# Patient Record
Sex: Male | Born: 1990 | Race: Black or African American | Hispanic: No | Marital: Single | Smoking: Light tobacco smoker
Health system: Southern US, Community
[De-identification: ages and names within clinical notes are randomized; demographics above are authoritative.]

## PROBLEM LIST (undated history)

## (undated) DIAGNOSIS — G40409 Other generalized epilepsy and epileptic syndromes, not intractable, without status epilepticus: Secondary | ICD-10-CM

## (undated) HISTORY — DX: Other generalized epilepsy and epileptic syndromes, not intractable, without status epilepticus: G40.409

---
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
## 2009-01-14 ENCOUNTER — Inpatient Hospital Stay (HOSPITAL_COMMUNITY): Admission: EM | Admit: 2009-01-14 | Discharge: 2009-01-16 | Payer: Self-pay | Admitting: Emergency Medicine

## 2009-01-29 ENCOUNTER — Ambulatory Visit: Payer: Self-pay | Admitting: Family Medicine

## 2009-01-29 DIAGNOSIS — G40909 Epilepsy, unspecified, not intractable, without status epilepticus: Secondary | ICD-10-CM

## 2009-01-29 DIAGNOSIS — G4700 Insomnia, unspecified: Secondary | ICD-10-CM

## 2009-01-29 LAB — CONVERTED CEMR LAB
ALT: 21 units/L (ref 0–53)
Alkaline Phosphatase: 71 units/L (ref 39–117)
Basophils Absolute: 0 10*3/uL (ref 0.0–0.1)
Basophils Relative: 1 % (ref 0–1)
Bilirubin Urine: NEGATIVE
Blood in Urine, dipstick: NEGATIVE
CO2: 22 meq/L (ref 19–32)
Calcium: 10 mg/dL (ref 8.4–10.5)
Chloride: 104 meq/L (ref 96–112)
Creatinine, Ser: 1 mg/dL (ref 0.40–1.50)
Eosinophils Absolute: 0.9 10*3/uL — ABNORMAL HIGH (ref 0.0–0.7)
Eosinophils Relative: 10 % — ABNORMAL HIGH (ref 0–5)
HCT: 46.8 % (ref 39.0–52.0)
Lymphocytes Relative: 40 % (ref 12–46)
Lymphs Abs: 3.3 10*3/uL (ref 0.7–4.0)
Nitrite: NEGATIVE
Platelets: 256 10*3/uL (ref 150–400)
Protein, U semiquant: NEGATIVE
RDW: 13.7 % (ref 11.5–15.5)
Sodium: 145 meq/L (ref 135–145)
Specific Gravity, Urine: 1.02
WBC Urine, dipstick: NEGATIVE

## 2009-01-30 ENCOUNTER — Encounter (INDEPENDENT_AMBULATORY_CARE_PROVIDER_SITE_OTHER): Payer: Self-pay | Admitting: Family Medicine

## 2009-02-26 ENCOUNTER — Emergency Department (HOSPITAL_COMMUNITY): Admission: EM | Admit: 2009-02-26 | Discharge: 2009-02-26 | Payer: Self-pay | Admitting: Emergency Medicine

## 2009-05-23 ENCOUNTER — Emergency Department (HOSPITAL_COMMUNITY): Admission: EM | Admit: 2009-05-23 | Discharge: 2009-05-23 | Payer: Self-pay | Admitting: Emergency Medicine

## 2009-09-17 ENCOUNTER — Emergency Department (HOSPITAL_COMMUNITY): Admission: EM | Admit: 2009-09-17 | Discharge: 2009-09-18 | Payer: Self-pay | Admitting: Emergency Medicine

## 2010-06-02 LAB — BASIC METABOLIC PANEL
BUN: 7 mg/dL (ref 6–23)
Calcium: 9.3 mg/dL (ref 8.4–10.5)
Creatinine, Ser: 1.13 mg/dL (ref 0.4–1.5)
GFR calc non Af Amer: >60 mL/min (ref >60)

## 2010-06-09 LAB — BASIC METABOLIC PANEL
BUN: 8 mg/dL (ref 6–23)
CO2: 23 mEq/L (ref 19–32)
GFR calc Af Amer: >60 mL/min (ref >60)
Glucose, Bld: 106 mg/dL — ABNORMAL HIGH (ref 70–99)
Potassium: 4.1 mEq/L (ref 3.5–5.1)
Sodium: 137 mEq/L (ref 135–145)

## 2010-06-18 LAB — D-DIMER, QUANTITATIVE: D-Dimer, Quant: <0.22 ug/mL-FEU (ref 0.00–0.48)

## 2010-06-20 LAB — DIFFERENTIAL
Basophils Absolute: 0 10*3/uL (ref 0.0–0.1)
Basophils Relative: 0 % (ref 0–1)
Eosinophils Absolute: 1.1 K/uL — ABNORMAL HIGH (ref 0.0–0.7)
Eosinophils Relative: 10 % — ABNORMAL HIGH (ref 0–5)
Lymphocytes Relative: 30 % (ref 12–46)
Lymphs Abs: 3.3 K/uL (ref 0.7–4.0)
Monocytes Absolute: 0.7 K/uL (ref 0.1–1.0)
Monocytes Relative: 7 % (ref 3–12)
Neutro Abs: 5.6 K/uL (ref 1.7–7.7)
Neutrophils Relative %: 52 % (ref 43–77)

## 2010-06-20 LAB — PHOSPHORUS: Phosphorus: 2.5 mg/dL (ref 2.3–4.6)

## 2010-06-20 LAB — COMPREHENSIVE METABOLIC PANEL WITH GFR
ALT: 13 U/L (ref 0–53)
BUN: 9 mg/dL (ref 6–23)
CO2: 21 meq/L (ref 19–32)
Calcium: 9.8 mg/dL (ref 8.4–10.5)
Creatinine, Ser: 1.12 mg/dL (ref 0.4–1.5)
Glucose, Bld: 144 mg/dL — ABNORMAL HIGH (ref 70–99)
Potassium: 4.1 meq/L (ref 3.5–5.1)
Sodium: 141 meq/L (ref 135–145)
Total Bilirubin: 0.4 mg/dL (ref 0.3–1.2)

## 2010-06-20 LAB — APTT: aPTT: 24 seconds (ref 24–37)

## 2010-06-20 LAB — CARDIAC PANEL(CRET KIN+CKTOT+MB+TROPI)
CK, MB: 1.5 ng/mL (ref 0.3–4.0)
Relative Index: 0.2 (ref 0.0–2.5)
Total CK: 607 U/L — ABNORMAL HIGH (ref 7–232)
Total CK: 805 U/L — ABNORMAL HIGH (ref 7–232)
Troponin I: 0.01 ng/mL (ref 0.00–0.06)
Troponin I: 0.02 ng/mL (ref 0.00–0.06)

## 2010-06-20 LAB — CBC
HCT: 42.5 % (ref 39.0–52.0)
Hemoglobin: 13.9 g/dL (ref 13.0–17.0)
MCHC: 32.8 g/dL (ref 30.0–36.0)
MCV: 85.8 fL (ref 78.0–100.0)
Platelets: 217 K/uL (ref 150–400)
RBC: 4.95 MIL/uL (ref 4.22–5.81)
RDW: 13.2 % (ref 11.5–15.5)
WBC: 10.7 K/uL — ABNORMAL HIGH (ref 4.0–10.5)

## 2010-06-20 LAB — COMPREHENSIVE METABOLIC PANEL
AST: 40 U/L — ABNORMAL HIGH (ref 0–37)
Albumin: 4.5 g/dL (ref 3.5–5.2)
Alkaline Phosphatase: 76 U/L (ref 39–117)
Chloride: 104 mEq/L (ref 96–112)
GFR calc Af Amer: >60 mL/min (ref >60)
GFR calc non Af Amer: >60 mL/min (ref >60)
Total Protein: 8 g/dL (ref 6.0–8.3)

## 2010-06-20 LAB — RAPID URINE DRUG SCREEN, HOSP PERFORMED
Amphetamines: NOT DETECTED
Barbiturates: NOT DETECTED
Benzodiazepines: NOT DETECTED
Cocaine: NOT DETECTED
Opiates: NOT DETECTED
Tetrahydrocannabinol: NOT DETECTED

## 2010-06-20 LAB — CK TOTAL AND CKMB (NOT AT ARMC)
Relative Index: 0.3 (ref 0.0–2.5)
Total CK: 355 U/L — ABNORMAL HIGH (ref 7–232)

## 2010-06-20 LAB — MAGNESIUM: Magnesium: 2.7 mg/dL — ABNORMAL HIGH (ref 1.5–2.5)

## 2010-06-20 LAB — ETHANOL: Alcohol, Ethyl (B): <5 mg/dL (ref 0–10)

## 2011-09-23 IMAGING — US US ABDOMEN COMPLETE
1 series · 14 of 25 positions shown · non-contrast
Comparison: None.

CLINICAL DATA: Abdominal pain, recent seizure

COMPLETE ABDOMINAL ULTRASOUND

[Series 1: us abdomen complete · 0.23mm/px · 14 of 59 slices shown]
[im 1/59]
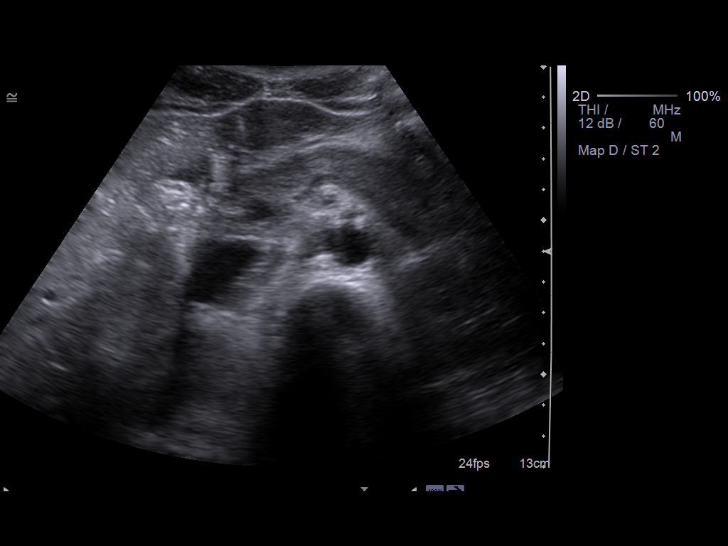
[im 5/59]
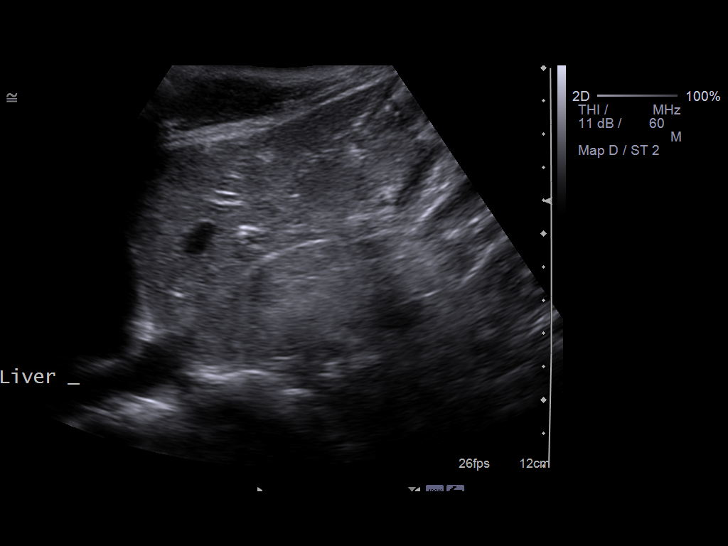
[im 10/59]
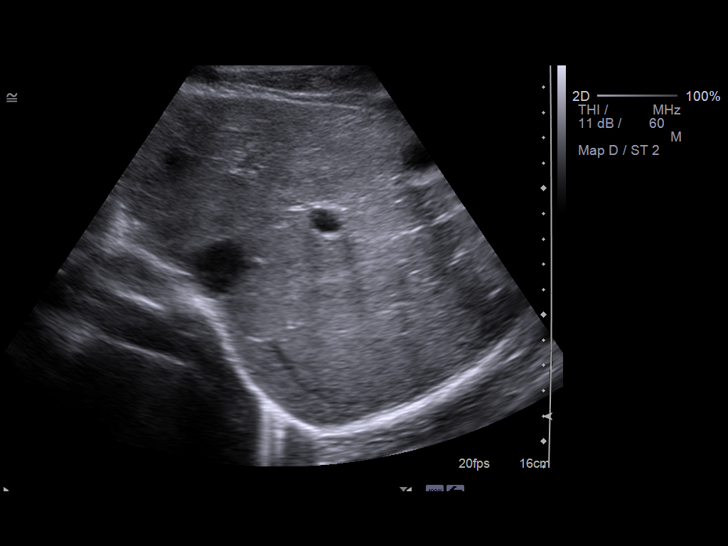
[im 15/59]
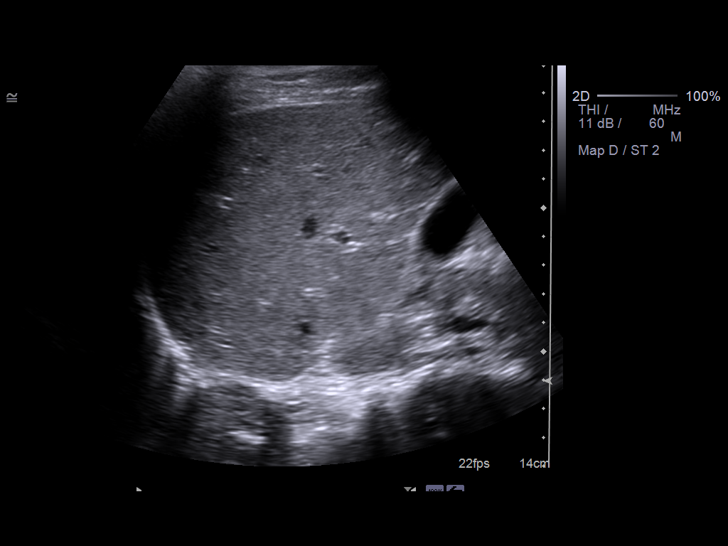
[im 20/59]
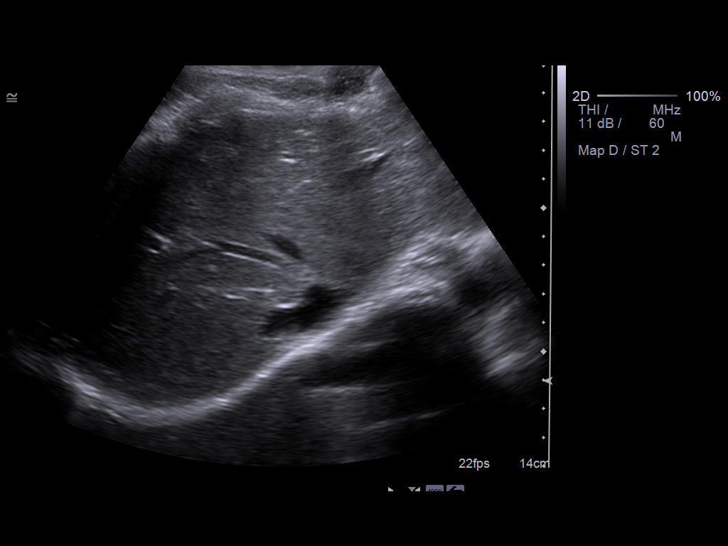
[im 22/59]
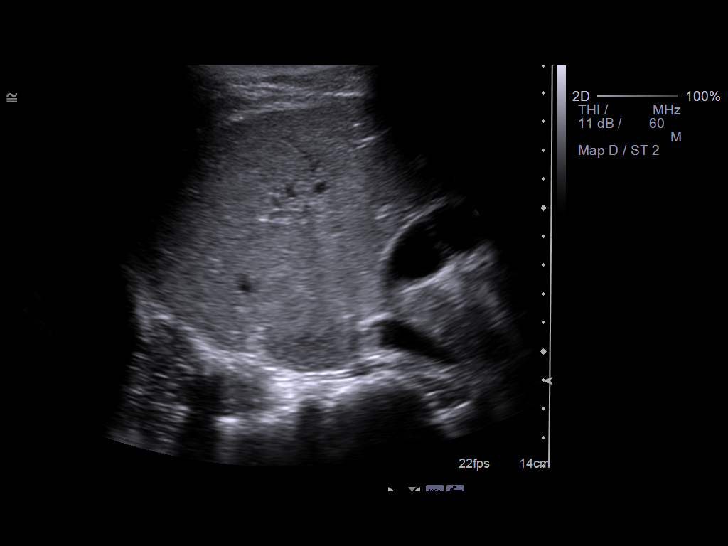
[im 27/59]
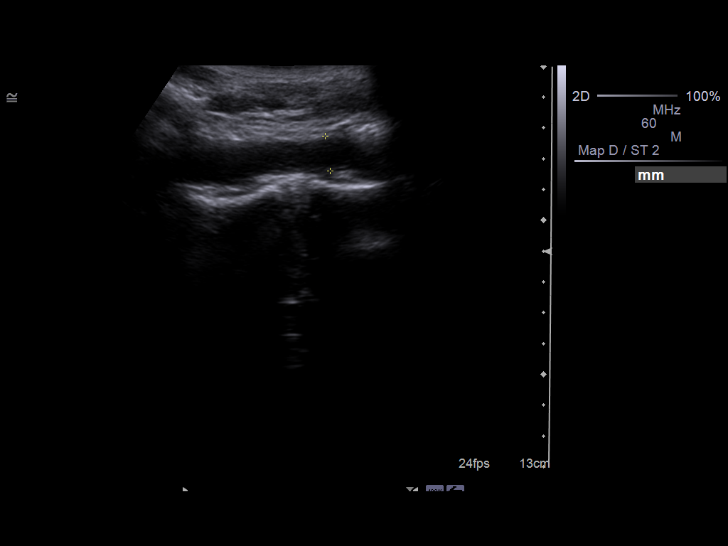
[im 32/59]
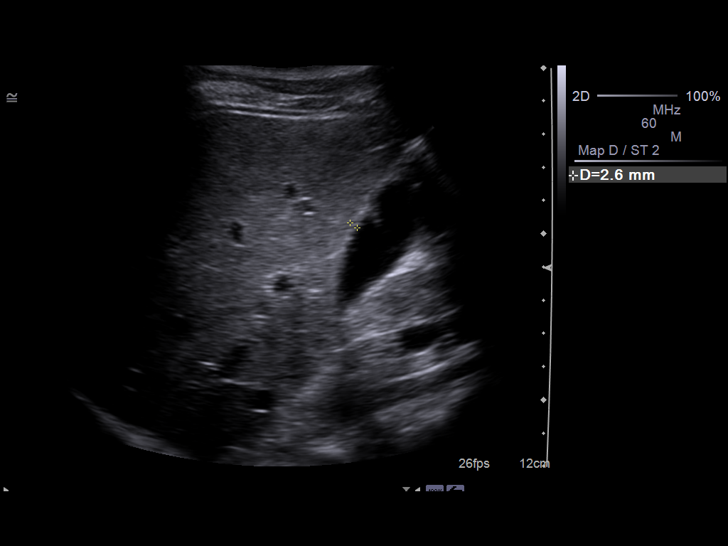
[im 37/59]
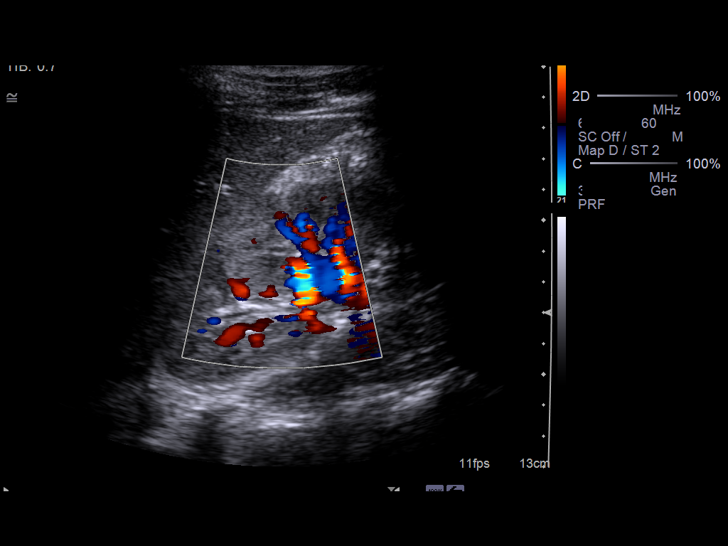
[im 39/59]
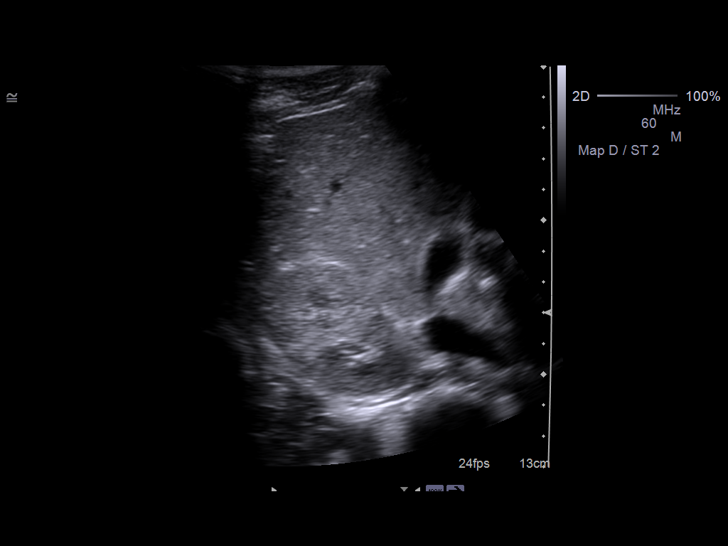
[im 44/59]
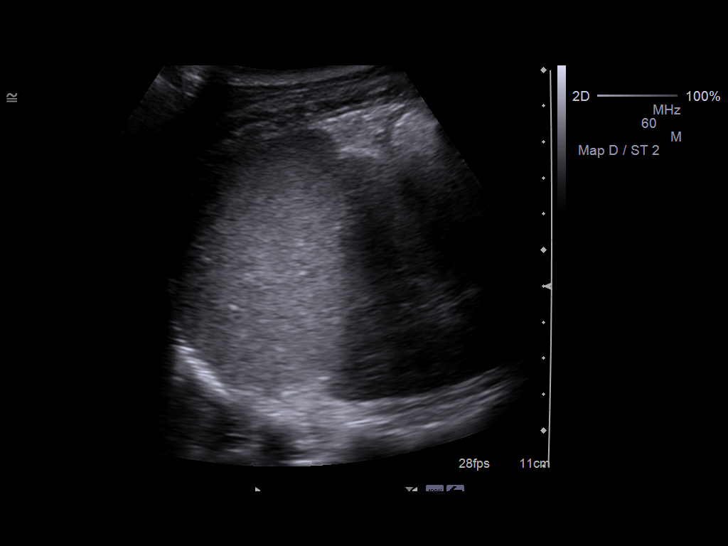
[im 49/59]
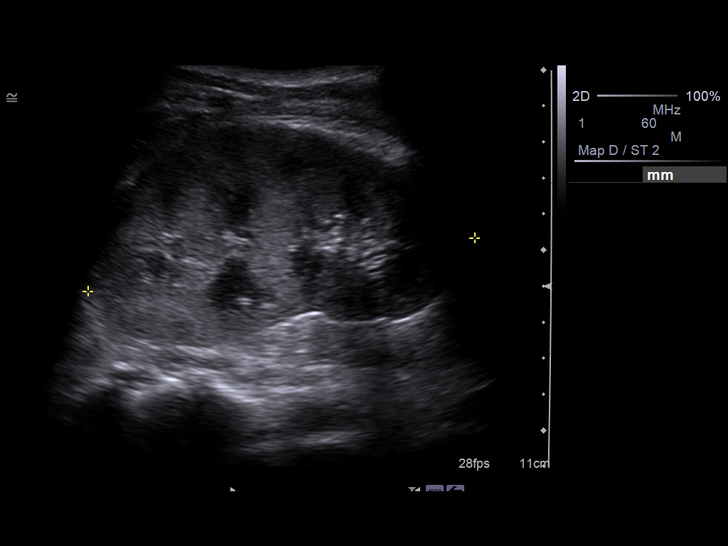
[im 54/59]
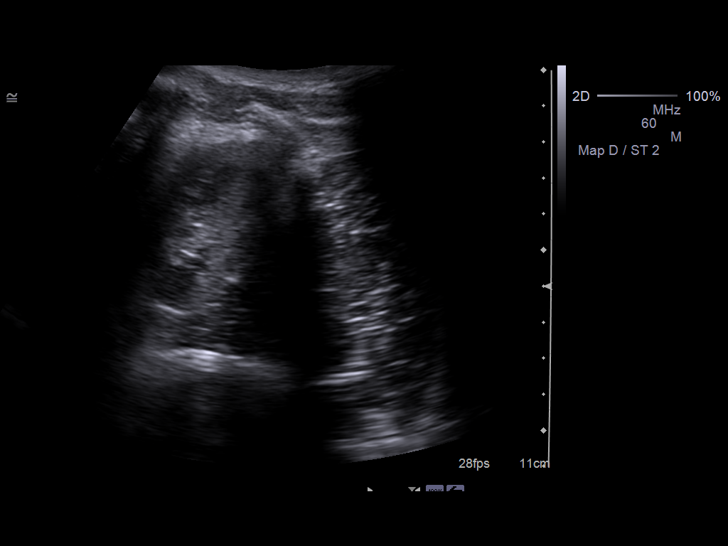
[im 59/59]
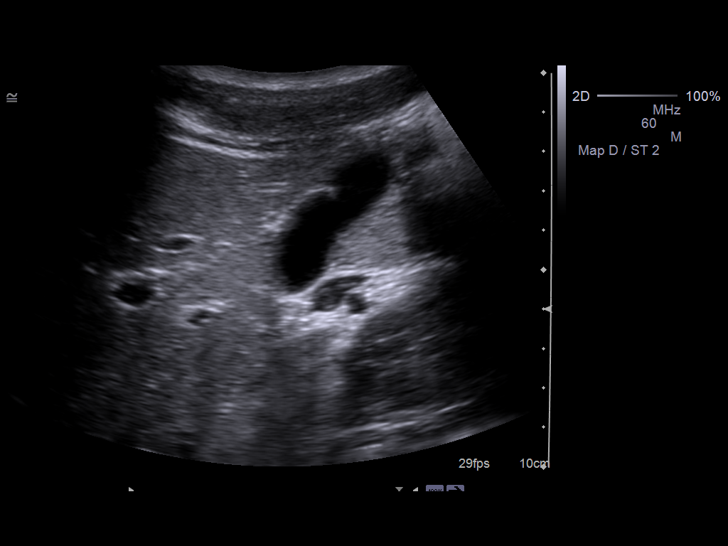

[14 of 25 positions shown; findings below may reference images not displayed]

FINDINGS: Gallbladder:  The gallbladder is moderately well seen and no
gallstones are noted.  No pain is present over the gallbladder.

Common bile duct:  The common bile duct is normal measuring 2 volts
4 mm in diameter.

Liver:  The liver has a normal echogenic pattern.  No ductal
dilatation is seen.

IVC:  Appears normal.

Pancreas:  No focal abnormality seen.

Spleen:  The spleen is normal in size measuring 4.1 cm sagittally.

Right Kidney:  No hydronephrosis is noted.  The right kidney
measures 11.0 cm sagittally.

Left Kidney:  No hydronephrosis.  The left kidney measures 10.9 cm.

Abdominal aorta:  The abdominal aorta is normal in caliber.
IMPRESSION: Negative abdominal ultrasound.

## 2013-03-20 ENCOUNTER — Emergency Department (HOSPITAL_COMMUNITY)
Admission: EM | Admit: 2013-03-20 | Discharge: 2013-03-20 | Disposition: A | Discharge status: Home / Self Care | Payer: Self-pay | Attending: Emergency Medicine | Admitting: Emergency Medicine

## 2013-03-20 ENCOUNTER — Encounter (HOSPITAL_COMMUNITY): Payer: Self-pay | Admitting: Emergency Medicine

## 2013-03-20 DIAGNOSIS — G40909 Epilepsy, unspecified, not intractable, without status epilepticus: Secondary | ICD-10-CM | POA: Insufficient documentation

## 2013-03-20 DIAGNOSIS — R569 Unspecified convulsions: Secondary | ICD-10-CM

## 2013-03-20 LAB — GLUCOSE, CAPILLARY: GLUCOSE-CAPILLARY: 150 mg/dL — AB (ref 70–99)

## 2013-03-20 MED ORDER — SODIUM CHLORIDE 0.9 % IV SOLN
1000.0000 mg | Freq: Once | INTRAVENOUS | Status: AC
  Administered 2013-03-20: 1000 mg via INTRAVENOUS
  Filled 2013-03-20: qty 20

## 2013-03-20 MED ORDER — PHENYTOIN SODIUM EXTENDED 100 MG PO CAPS: 300.0000 mg | ORAL_CAPSULE | Freq: Every day | ORAL | Status: DC

## 2013-03-20 NOTE — ED Notes (Signed)
Pt. On cardiac monitor. 

## 2013-03-20 NOTE — ED Notes (Addendum)
Per EMS, Pt was watching TV with sister and started seeing flashing lights. Reported by sister that pt had grand mal seizure.  He had an emesis episode, incontinent to urine, and clenching teeth when EMS arrived.  Seizure activity lasted 2 mins. He stopped taking medication 1 year ago due to finances. Does not know what patient has taken in the past for  Seizures.

## 2013-03-20 NOTE — ED Notes (Signed)
Family at bedside. 

## 2013-03-20 NOTE — ED Provider Notes (Signed)
CSN: [id number]     Arrival date & time 03/20/13  0121 History   First MD Initiated Contact with Patient 03/20/13 0135     Chief Complaint  Patient presents with  . Seizures   (Consider location/radiation/quality/duration/timing/severity/associated sxs/prior Treatment) HPI 23 yo male presents to the ER from home via EMS with reported seizure.  Pt is back to baseline, does not remember episode.  Per family, pt was watching tv and had tonic clonic seizure.  He was incontinent of urine.  Pt has h/o seizure disorder, has been on keppra and depakote, unable to afford, was on dilantin, but has not filled in some time due to finances.  Past Medical History  Diagnosis Date  . Seizures    History reviewed. No pertinent past surgical history. History reviewed. No pertinent family history. History  Substance Use Topics  . Smoking status: Never Smoker   . Smokeless tobacco: Never Used  . Alcohol Use: No    Review of Systems  All other systems reviewed and are negative.    Allergies  Review of patient's allergies indicates no known allergies.  Home Medications   Current Outpatient Rx  Name  Route  Sig  Dispense  Refill  . phenytoin (DILANTIN) 100 MG ER capsule   Oral   Take 3 capsules (300 mg total) by mouth at bedtime.   90 capsule   0    BP 107/66  Pulse 75  Resp 16  SpO2 100% Physical Exam  Nursing note and vitals reviewed. Constitutional: He is oriented to person, place, and time. He appears well-developed and well-nourished.  HENT:  Head: Normocephalic and atraumatic.  Right Ear: External ear normal.  Left Ear: External ear normal.  Nose: Nose normal.  Mouth/Throat: Oropharynx is clear and moist.  Eyes: Conjunctivae and EOM are normal. Pupils are equal, round, and reactive to light.  Neck: Normal range of motion. Neck supple. No JVD present. No tracheal deviation present. No thyromegaly present.  Cardiovascular: Normal rate, regular rhythm, normal heart sounds and  intact distal pulses.  Exam reveals no gallop and no friction rub.   No murmur heard. Pulmonary/Chest: Effort normal and breath sounds normal. No stridor. No respiratory distress. He has no wheezes. He has no rales. He exhibits no tenderness.  Abdominal: Soft. Bowel sounds are normal. He exhibits no distension and no mass. There is no tenderness. There is no rebound and no guarding.  Musculoskeletal: Normal range of motion. He exhibits no edema and no tenderness.  Lymphadenopathy:    He has no cervical adenopathy.  Neurological: He is alert and oriented to person, place, and time. He has normal reflexes. No cranial nerve deficit. He exhibits normal muscle tone. Coordination normal.  Skin: Skin is warm and dry. No rash noted. No erythema. No pallor.  Psychiatric: He has a normal mood and affect. His behavior is normal. Judgment and thought content normal.    ED Course  Procedures (including critical care time) Labs Review Labs Reviewed  GLUCOSE, CAPILLARY - Abnormal; Notable for the following:    Glucose-Capillary 150 (*)    All other components within normal limits   Imaging Review No results found.  EKG Interpretation   None       MDM   1. SEIZURE DISORDER    23 year old male with history of seizure disorder.  He is not taking his Dilantin as prescribed due to financial difficulties.  Patient was loaded on Dilantin.  He is instructed that is very important to fill his  Dilantin prescription.  Family reports they would be able to help him fill the prescription    Olivia Mackie, MD 03/23/13 2028

## 2013-03-20 NOTE — Discharge Instructions (Signed)
Driving and Equipment Restrictions Some medical problems make it dangerous to drive, ride a bike, or use machines. Some of these problems are:  A hard blow to the head (concussion).  Passing out (fainting).  Twitching and shaking (seizures).  Low blood sugar.  Taking medicine to help you relax (sedatives).  Taking pain medicines.  Wearing an eye patch.  Wearing splints. This can make it hard to use parts of your body that you need to drive safely. HOME CARE   Do not drive until your doctor says it is okay.  Do not use machines until your doctor says it is okay. You may need a form signed by your doctor (medical release) before you can drive again. You may also need this form before you do other tasks where you need to be fully alert. MAKE SURE YOU:  Understand these instructions.  Will watch your condition.  Will get help right away if you are not doing well or get worse. Document Released: 04/10/2004 Document Revised: 05/26/2011 Document Reviewed: 07/11/2009 [facility] Patient Information 2014 [place], [place].  Epilepsy A seizure (convulsion) is a sudden change in brain function that causes a change in behavior, muscle activity, or ability to remain awake and alert. If a person has recurring seizures, this is called epilepsy. CAUSES  Epilepsy is a disorder with many possible causes. Anything that disturbs the normal pattern of brain cell activity can lead to seizures. Seizure can be caused from illness to brain damage to abnormal brain development. Epilepsy may develop because of:  An abnormality in brain wiring.  An imbalance of nerve signaling chemicals (neurotransmitters).  Some combination of these factors. Scientists are learning an increasing amount about genetic causes of seizures. SYMPTOMS  The symptoms of a seizure can vary greatly from one person to another. These may include:  An aura, or warning that tells a person they are about to have a  seizure.  Abnormal sensations, such as abnormal smell or seeing flashing lights.  Sudden, general body stiffness.  Rhythmic jerking of the face, arm, or leg  on one or both sides.  Sudden change in consciousness.  The person may appear to be awake but not responding.  They may appear to be asleep but cannot be awakened.  Grimacing, chewing, lip smacking, or drooling.  Often there is a period of sleepiness after a seizure. DIAGNOSIS  The description you give to your caregiver about what you experienced will help them understand your problems. Equally important is the description by any witnesses to your seizure. A physical exam, including a detailed neurological exam, is necessary. An EEG (electroencephalogram) is a painless test of your brain waves. In this test a diagram is created of your brain waves. These diagrams can be interpreted by a specialist. Pictures of your brain are usually taken with:  An MRI.  A CT scan. Lab tests may be done to look for:  Signs of infection.  Abnormal blood chemistry. PREVENTION  There is no way to prevent the development of epilepsy. If you have seizures that are typically triggered by an event (such as flashing lights), try to avoid the trigger. This can help you avoid a seizure.  PROGNOSIS  Most people with epilepsy lead outwardly normal lives. While epilepsy cannot currently be cured, for some people it does eventually go away. Most seizures do not cause brain damage. It is not uncommon for people with epilepsy, especially children, to develop behavioral and emotional problems. These problems are sometimes the consequence of medicine  for seizures or social stress. For some people with epilepsy, the risk of seizures restricts their independence and recreational activities. For example, some states refuse drivers licenses to people with epilepsy. Most women with epilepsy can become pregnant. They should discuss their epilepsy and the medicine they  are taking with their caregivers. Women with epilepsy have a 90 percent or better chance of having a normal, healthy baby. RISKS AND COMPLICATIONS  People with epilepsy are at increased risk of falls, accidents, and injuries. People with epilepsy are at special risk for two life-threatening conditions. These are status epilepticus and sudden unexplained death (extremely rare). Status epilepticus is a long lasting, continuous seizure that is a medical emergency. TREATMENT  Once epilepsy is diagnosed, it is important to begin treatment as soon as possible. For about 80 percent of those diagnosed with epilepsy, seizures can be controlled with modern medicines and surgical techniques. Some antiepileptic drugs can interfere with the effectiveness of oral contraceptives. In 1997, the FDA approved a pacemaker for the brain the (vagus nerve stimulator). This stimulator can be used for people with seizures that are not well-controlled by medicine. Studies have shown that in some cases, children may experience fewer seizures if they maintain a strict diet. The strict diet is called the ketogenic diet. This diet is rich in fats and low in carbohydrates. HOME CARE INSTRUCTIONS   Your caregiver will make recommendations about driving and safety in normal activities. Follow these carefully.  Take any medicine prescribed exactly as directed.  Do any blood tests requested to monitor the levels of your medicine.  The people you live and work with should know that you are prone to seizures. They should receive instructions on how to help you. In general, a witness to a seizure should:  Cushion your head and body.  Turn you on your side.  Avoid unnecessarily restraining you.  Not place anything inside your mouth.  Call for local emergency medical help if there is any question about what has occurred.  Keep a seizure diary. Record what you recall about any seizure, especially any possible trigger.  If your  caregiver has given you a follow-up appointment, it is very important to keep that appointment. Not keeping the appointment could result in permanent injury and disability. If there is any problem keeping the appointment, you must call back to this facility for assistance. SEEK MEDICAL CARE IF:   You develop signs of infection or other illness. This might increase the risk of a seizure.  You seem to be having more frequent seizures.  Your seizure pattern is changing. SEEK IMMEDIATE MEDICAL CARE IF:   A seizure does not stop after a few moments.  A seizure causes any difficulty in breathing.  A seizure results in a very severe headache.  A seizure leaves you with the inability to speak or use a part of your body. MAKE SURE YOU:   Understand these instructions.  Will watch your condition.  Will get help right away if you are not doing well or get worse. Document Released: 03/03/2005 Document Revised: 05/26/2011 Document Reviewed: 10/13/2012 [facility] Patient Information 2014 [place], [place].  Seizure, Adult A seizure is abnormal electrical activity in the brain. Seizures can cause a change in attention or behavior (altered mental status). Seizures often involve uncontrollable shaking (convulsions). Seizures usually last from 30 seconds to 2 minutes. Epilepsy is a brain disorder in which a patient has repeated seizures over time. CAUSES  There are many different problems that can cause  seizures. In some cases, no cause is found. Common causes of seizures include:  Head injuries.  Brain tumors.  Infections.  Imbalance of chemicals in the blood.  Kidney failure or liver failure.  Heart disease.  Drug abuse.  Stroke.  Withdrawal from certain drugs or alcohol.  Birth defects.  Malfunction of a neurosurgical device placed in the brain. SYMPTOMS  Symptoms vary depending on the part of the brain that is involved. Right before a seizure, you may have a warning (aura) that a  seizure is about to occur. An aura may include the following symptoms:   Fear or anxiety.  Nausea.  Feeling like the room is spinning (vertigo).  Vision changes, such as seeing flashing lights or spots. Common symptoms during a seizure include:  Convulsions.  Drooling.  Rapid eye movements.  Grunting.  Loss of bladder and bowel control.  Bitter taste in the mouth. After a seizure, you may feel confused and sleepy. You may also have an injury resulting from convulsions during the seizure. DIAGNOSIS  Your caregiver will perform a physical exam and run some tests to determine the type and cause of your seizure. These tests may include:  Blood tests.  A lumbar puncture test. In this test, a small amount of fluid is removed from the spine and examined.  Electrocardiography (ECG). This test records the electrical activity in your heart.  Imaging tests, such as computed tomography (CT) scans or magnetic resonance imaging (MRI).  Electroencephalography (EEG). This test records the electrical activity in your brain. TREATMENT  Seizures usually stop on their own. Treatment will depend on the cause of your seizure. In some cases, medicine may be given to prevent future seizures. HOME CARE INSTRUCTIONS   If you are given medicines, take them exactly as prescribed by your caregiver.  Keep all follow-up appointments as directed by your caregiver.  Do not swim or drive until your caregiver says it is okay.  Teach friends and family what to do if you have a seizure. They should:  Lay you on the ground to prevent a fall.  Put a cushion under your head.  Loosen any tight clothing around your neck.  Turn you on your side. If vomiting occurs, this helps keep your airway clear.  Stay with you until you recover. SEEK IMMEDIATE MEDICAL CARE IF:  The seizure lasts longer than 2 to 5 minutes.  The seizure is severe or the person does not wake up after the seizure.  The person has  altered mental status. Drive the person to the emergency department or call your local emergency services (911 in [place] MAKE SURE YOU:  Understand these instructions.  Will watch your condition.  Will get help right away if you are not doing well or get worse. Document Released: 02/29/2000 Document Revised: 05/26/2011 Document Reviewed: 02/19/2011 [facility] Patient Information 2014 [place], [place].

## 2013-03-20 NOTE — ED Notes (Signed)
Bed: [id number] Expected date: 03/20/13 Expected time: 1:06 AM Means of arrival: Ambulance Comments: seizure

## 2013-03-20 NOTE — ED Notes (Addendum)
Pt. CBG 150. RN, Sharlet SalinaBenjamin made aware.

## 2013-11-03 ENCOUNTER — Emergency Department (INDEPENDENT_AMBULATORY_CARE_PROVIDER_SITE_OTHER)
Admission: EM | Admit: 2013-11-03 | Discharge: 2013-11-03 | Disposition: A | Discharge status: Home / Self Care | Payer: Self-pay | Source: Home / Self Care | Attending: Emergency Medicine | Admitting: Emergency Medicine

## 2013-11-03 ENCOUNTER — Encounter (HOSPITAL_COMMUNITY): Payer: Self-pay | Admitting: Emergency Medicine

## 2013-11-03 DIAGNOSIS — G40909 Epilepsy, unspecified, not intractable, without status epilepticus: Secondary | ICD-10-CM

## 2013-11-03 MED ORDER — PHENYTOIN SODIUM EXTENDED 100 MG PO CAPS: 300.0000 mg | ORAL_CAPSULE | Freq: Every day | ORAL | Status: DC

## 2013-11-03 NOTE — Discharge Instructions (Signed)

## 2013-11-03 NOTE — ED Provider Notes (Signed)
Chief Complaint   Chief Complaint  Patient presents with  . Seizures     History of Present Illness   William Gomez is a 23 year old male a 4 year history of seizures. His last actual seizure was over a year ago. He was hospitalized when this first began, but is not sure what was found. He had been on Keppra and Depakote but because of cost considerations he was switched to Dilantin 100 mg 3 at bedtime. He ran out of these about a year ago has not taken any medication since then. He does not have a primary care physician or neurologist and usually just goes to the emergency room to get his prescription for his seizure medication. He's been seizure free for over a year. This past Sunday, 5 days ago, he had an episode where he felt like he was close to having a seizure. He felt confused, dizzy, had a funny taste in his mouth. This lasted for about 30 minutes then went away. He did not actually have a seizure or lose consciousness. Today he had the same thing. He denies any obvious precipitating factors. He's not taking any other medications or using any street drugs. He denies use of alcohol. He's been eating and sleeping well. He denies being under any stress. He's had no other neurological symptoms such as fever, chills, headache, vision symptoms, chest pain, shortness of breath, abdominal pain, vomiting, diarrhea, paresthesias, weakness, or difficulty with speech or ambulation.  Review of Systems   Other than as noted above, the patient denies any of the following symptoms: Systemic:  No fever, chills, photophobia, stiff neck. Eye:  No blurred vision or diplopia. ENT:  No nasal congestion or rhinorrhea.  No jaw claudication. Neuro:  No paresthesias, loss of consciousness, seizure activity, muscle weakness, trouble with coordination or gait, trouble speaking or swallowing. Psych:  No depression, anxiety or trouble sleeping.  PMFSH   Past medical history, family history, social history,  meds, and allergies were reviewed.    Physical Examination     Vital signs:  BP 128/78  Pulse 59  Temp(Src) 98.8 F (37.1 C) (Oral)  Resp 16  SpO2 100% General:  Alert and oriented.  In no distress. Eye:  Lids and conjunctivas normal.  PERRL,  Full EOMs.  Fundi benign with normal discs and vessels. ENT:  No cranial or facial tenderness to palpation.  TMs and canals clear.  Nasal mucosa was normal and uncongested without any drainage. No intra oral lesions, pharynx clear, mucous membranes moist, dentition normal. Neck:  Supple, full ROM, no tenderness to palpation.  No adenopathy or mass. No carotid bruit. Lungs: Clear to auscultation. Heart: Regular rhythm, no gallop or murmur. Neuro:  Alert and orented times 3.  Speech was clear, fluent, and appropriate.  Cranial nerves intact. No pronator drift, muscle strength normal. Finger to nose normal.  DTRs were 2+ and symmetrical.Station and gait were normal.  Romberg's sign was normal.  Able to perform tandem gait well. Psych:  Normal affect.  Assessment   The encounter diagnosis was Seizure disorder.  Plan   1.  Meds:  The following meds were prescribed:   Discharge Medication List as of 11/03/2013  3:50 PM    START taking these medications   Details  !! phenytoin (DILANTIN) 100 MG ER capsule Take 3 capsules (300 mg total) by mouth at bedtime., Starting 11/03/2013, Until Discontinued, Normal     !! - Potential duplicate medications found. Please discuss with provider.  2.  Patient Education/Counseling:  The patient was given appropriate handouts, self care instructions, and instructed in symptomatic relief.  The importance of having a regular primary care doctor or neurologist was emphasized with him. [facility]. I also warned him about heights, swimming, tub bathing, and I think it's okay for him to drive a car since he has not actually had a seizure in over a year. I also warned him  to get plenty of sleep and maintained normal meal habits.  3.  Follow up:  The patient was told to follow up here if no better in 3 to 4 days, or sooner if becoming worse in any way, and given some red flag symptoms such as fever, severe headache, persistent vomiting or new or changing neurological symptoms which would prompt immediate return.        Reuben Likesavid C Mry Lamia, MD 11/03/13 [phone]

## 2013-11-03 NOTE — ED Notes (Signed)
C/o having a hx of seizures and has been without meds x 1 yr due to insurance issues.    Pt reports having two episodes of feeling faint and dizzy on Sunday and an episode earlier today.

## 2013-11-15 ENCOUNTER — Emergency Department (HOSPITAL_COMMUNITY): Payer: Self-pay

## 2013-11-15 ENCOUNTER — Encounter (HOSPITAL_COMMUNITY): Payer: Self-pay | Admitting: Emergency Medicine

## 2013-11-15 ENCOUNTER — Emergency Department (HOSPITAL_COMMUNITY)
Admission: EM | Admit: 2013-11-15 | Discharge: 2013-11-15 | Disposition: A | Discharge status: Home / Self Care | Payer: Self-pay | Attending: Emergency Medicine | Admitting: Emergency Medicine

## 2013-11-15 DIAGNOSIS — R42 Dizziness and giddiness: Secondary | ICD-10-CM | POA: Insufficient documentation

## 2013-11-15 DIAGNOSIS — R110 Nausea: Secondary | ICD-10-CM | POA: Insufficient documentation

## 2013-11-15 DIAGNOSIS — I319 Disease of pericardium, unspecified: Secondary | ICD-10-CM | POA: Insufficient documentation

## 2013-11-15 DIAGNOSIS — F17200 Nicotine dependence, unspecified, uncomplicated: Secondary | ICD-10-CM | POA: Insufficient documentation

## 2013-11-15 DIAGNOSIS — R079 Chest pain, unspecified: Secondary | ICD-10-CM | POA: Insufficient documentation

## 2013-11-15 DIAGNOSIS — Z79899 Other long term (current) drug therapy: Secondary | ICD-10-CM | POA: Insufficient documentation

## 2013-11-15 LAB — CBC
HEMATOCRIT: 42.8 % (ref 39.0–52.0)
HEMOGLOBIN: 13.9 g/dL (ref 13.0–17.0)
MCH: 27.2 pg (ref 26.0–34.0)
MCHC: 32.5 g/dL (ref 30.0–36.0)
MCV: 83.8 fL (ref 78.0–100.0)
Platelets: 219 10*3/uL (ref 150–400)
RBC: 5.11 MIL/uL (ref 4.22–5.81)
RDW: 12.8 % (ref 11.5–15.5)
WBC: 5.7 10*3/uL (ref 4.0–10.5)

## 2013-11-15 LAB — BASIC METABOLIC PANEL
Anion gap: 12 (ref 5–15)
BUN: 11 mg/dL (ref 6–23)
CALCIUM: 9.5 mg/dL (ref 8.4–10.5)
CO2: 28 mEq/L (ref 19–32)
Chloride: 102 mEq/L (ref 96–112)
Creatinine, Ser: 1.02 mg/dL (ref 0.50–1.35)
GLUCOSE: 88 mg/dL (ref 70–99)
Potassium: 4.5 mEq/L (ref 3.7–5.3)
Sodium: 142 mEq/L (ref 137–147)

## 2013-11-15 LAB — I-STAT TROPONIN, ED: Troponin i, poc: 0 ng/mL (ref 0.00–0.08)

## 2013-11-15 MED ORDER — NAPROXEN 500 MG PO TABS: 500.0000 mg | ORAL_TABLET | Freq: Two times a day (BID) | ORAL | Status: DC

## 2013-11-15 MED ORDER — IBUPROFEN 800 MG PO TABS
800.0000 mg | ORAL_TABLET | Freq: Once | ORAL | Status: AC
  Administered 2013-11-15: 800 mg via ORAL
  Filled 2013-11-15: qty 1

## 2013-11-15 NOTE — ED Notes (Signed)
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  Pt states he had to sit down so he wouldn't pass out.  Pt states he had a headache 4/10 pain.  Denies n/v, SOB or diaphoresis.  States head ache is 2/10 now.

## 2013-11-15 NOTE — Discharge Instructions (Signed)
Please follow up with your primary care physician in 1-2 days. If you do not have one please call the [facility] number listed above. Please take Naproxen as prescribed. Please read all discharge instructions and return precautions.    Pericarditis Pericarditis is swelling (inflammation) of the pericardium. The pericardium is a thin, double-layered, fluid-filled tissue sac that surrounds the heart. The purpose of the pericardium is to contain the heart in the chest cavity and keep the heart from overexpanding. Different types of pericarditis can occur, such as:  Acute pericarditis. Inflammation can develop suddenly in acute pericarditis.  Chronic pericarditis. Inflammation develops gradually and is long-lasting in chronic pericarditis.  Constrictive pericarditis. In this type of pericarditis, the layers of the pericardium stiffen and develop scar tissue. The scar tissue thickens and sticks together. This makes it difficult for the heart to pump and work as it normally does. CAUSES  Pericarditis can be caused from different conditions, such as:  A bacterial, fungal or viral infection.  After a heart attack (myocardial infarction).  After open-heart surgery (coronary bypass graft surgery).  Auto-immune conditions such as lupus, rheumatoid arthritis or scleroderma.  Kidney failure.  Low thyroid condition (hypothyroidism).  Cancer from another part of the body that has spread (metastasized) to the pericardium.  Chest injury or trauma.  After radiation treatment.  Certain medicines. SYMPTOMS  Symptoms of pericarditis can include:  Chest pain. Chest pain symptoms may increase when laying down and may be relieved when sitting up and leaning forward.  A chronic, dry cough.  Heart palpitations. These may feel like rapid, fluttering or pounding heart beats.  Chest pain may be worse when swallowing.  Dizziness or fainting.  Tiredness, fatigue or  lethargy.  Fever. DIAGNOSIS  Pericarditis is diagnosed by the following:  A physical exam. A heart sound called a pericardial friction rub may be heard when your caregiver listens to your heart.  Blood work. Blood may be drawn to check for an infection and to look at your blood chemistry.  Electrocardiography. During electrocardiography your heart's electrical activity is monitored and recorded with a tracing on paper (electrocardiogram [ECG]).  Echocardiography.  Computed tomography (CT).  Magnetic resonance image (MRI). TREATMENT  To treat pericarditis, it is important to know the cause of it. The cause of pericarditis determines the treatment.   If the cause of pericarditis is due to an infection, treatment is based on the type of infection. If an infection is suspected in the pericardial fluid, a procedure called a pericardial fluid culture and biopsy may be done. This takes a sample of the pericardial fluid. The sample is sent to a lab which runs tests on the pericardial fluid to check for an infection.  If the autoimmune disease is the cause, treatment of the autoimmune condition will help improve the pericarditis.  If the cause of pericarditis is not known, anti-inflammatory medicines may be used to help decrease the inflammation.  Surgery may be needed. The following are types of surgeries or procedures that may be done to treat pericarditis:  Pericardial window. A pericardial window makes a cut (incision) into the pericardial sac. This allows excess fluid in the pericardium to drain.  Pericardiocentesis. A pericardiocentesis is also known as a pericardial tap. This procedure uses a needle that is guided by X-ray to drain (aspirate) excess fluid from the pericardium.  Pericardiectomy. A pericardiectomy removes part or all of the pericardium. HOME CARE INSTRUCTIONS   Do not smoke. If you smoke, quit. Your  caregiver can help you quit smoking.  Maintain a healthy  weight.  Follow an exercise program as told by your caregiver.  If you drink alcohol, do so in moderation.  Eat a heart healthy diet. A registered dietician can help you learn about healthy food choices.  Keep a list of all your medicines with you at all times. Include the name, dose, how often it is taken and how it is taken. SEEK IMMEDIATE MEDICAL CARE IF:   You have chest pain or feelings of chest pressure.  You have sweating (diaphoresis) when at rest.  You have irregular heartbeats (palpitations).  You have rapid, racing heart beats.  You have unexplained fainting episodes.  You feel sick to your stomach (nausea) or vomiting without cause.  You have unexplained weakness. If you develop any of the symptoms which originally made you seek care, call for local emergency medical help. Do not drive yourself to the hospital. Document Released: 08/27/2000 Document Revised: 05/26/2011 Document Reviewed: 03/05/2011 [facility] Patient Information 2015 [place], [place]. This information is not intended to replace advice given to you by your health care provider. Make sure you discuss any questions you have with your health care provider.

## 2013-11-15 NOTE — ED Provider Notes (Signed)
CSN: [id number]     Arrival date & time 11/15/13  1100 History   First MD Initiated Contact with Patient 11/15/13 1521     Chief Complaint  Patient presents with  . Chest Pain     (Consider location/radiation/quality/duration/timing/severity/associated sxs/prior Treatment) HPI Comments: Patient is a 23 year old male past medical history significant for seizures, tobacco use presented to the emergency department for acute onset intermittent episodes of left-sided squeezing chest pain with radiation to both arms with associated nausea and lightheadedness. Patient states each episode lasts approximately a few minutes to 30 minutes and is alleviated with rest. No medications taken prior to arrival. No known precipitating event. No early familial cardiac history. PERC negative.  Patient is a 23 y.o. male presenting with chest pain.  Chest Pain Associated symptoms: nausea   Associated symptoms: no fever and not vomiting     Past Medical History  Diagnosis Date  . Seizures    History reviewed. No pertinent past surgical history. No family history on file. History  Substance Use Topics  . Smoking status: Current Some Day Smoker  . Smokeless tobacco: Never Used  . Alcohol Use: No    Review of Systems  Constitutional: Negative for fever and chills.  Cardiovascular: Positive for chest pain. Negative for leg swelling.  Gastrointestinal: Positive for nausea. Negative for vomiting.  Neurological: Positive for light-headedness. Negative for syncope.  All other systems reviewed and are negative.     Allergies  Review of patient's allergies indicates no known allergies.  Home Medications   Prior to Admission medications   Medication Sig Start Date End Date Taking? Authorizing Provider  phenytoin (DILANTIN) 100 MG ER capsule Take 3 capsules (300 mg total) by mouth at bedtime. 03/20/13  Yes Olivia Mackie, MD  naproxen (NAPROSYN) 500 MG tablet Take 1 tablet (500 mg total) by mouth 2 (two)  times daily with a meal. 11/15/13   Zaheer Wageman L Maebelle Sulton, PA-C   BP 134/102  Pulse 63  Temp(Src) 98 F (36.7 C) (Oral)  Resp 14  Ht  (1.803 m)  Wt 142 lb (64.411 kg)  BMI 19.81 kg/m2  SpO2 100% Physical Exam  Nursing note and vitals reviewed. Constitutional: He is oriented to person, place, and time. He appears well-developed and well-nourished. No distress.  HENT:  Head: Normocephalic and atraumatic.  Right Ear: External ear normal.  Left Ear: External ear normal.  Nose: Nose normal.  Mouth/Throat: Oropharynx is clear and moist.  Eyes: Conjunctivae are normal.  Neck: Neck supple.  Cardiovascular: Normal rate, regular rhythm, normal heart sounds and intact distal pulses.   No murmur heard. Pulmonary/Chest: Effort normal and breath sounds normal. No respiratory distress. He exhibits no tenderness.  Abdominal: Soft. There is no tenderness.  Musculoskeletal: Normal range of motion. He exhibits no edema.  Lymphadenopathy:    He has no cervical adenopathy.  Neurological: He is alert and oriented to person, place, and time.  Skin: Skin is warm and dry. He is not diaphoretic.    ED Course  Procedures (including critical care time) Labs Review Labs Reviewed  CBC  BASIC METABOLIC PANEL  Rosezena Sensor, ED    Imaging Review Dg Chest 2 View  11/15/2013   CLINICAL DATA:  Chest pain.  EXAM: CHEST  2 VIEW  COMPARISON:  February 26, 2009.  FINDINGS: The heart size and mediastinal contours are within normal limits. Both lungs are clear. No pneumothorax or pleural effusion is noted. The visualized skeletal structures are unremarkable.  IMPRESSION: No  acute cardiopulmonary abnormality seen.   Electronically Signed   By: Roque Lias M.D.   On: 11/15/2013 11:43     EKG Interpretation None       Date: 11/15/2013  Rate: 85  Rhythm: normal sinus rhythm  QRS Axis: normal  Intervals: normal  ST/T Wave abnormalities: ST elevations diffusely  Conduction Disutrbances:none   Narrative Interpretation:   Old EKG Reviewed: none available   MDM   Final diagnoses:  Pericarditis    Filed Vitals:   11/15/13 1530  BP: 134/102  Pulse: 63  Temp:   Resp: 14   Afebrile, NAD, non-toxic appearing, AAOx4. Patient is to be discharged with recommendation to follow up with PCP in regards to today's hospital visit. No murmur, rubs, or gallops. Perc negative, VSS, no tracheal deviation, no JVD or new murmur, RRR, breath sounds equal bilaterally, EKG with diffuse ST elevation, negative troponin, and negative CXR. Pt has been advised start a PPI and return to the ED is CP becomes exertional, associated with diaphoresis or nausea, radiates to left jaw/arm, worsens or becomes concerning in any way. Pt appears reliable for follow up and is agreeable to discharge.   Case has been discussed with Dr. Jeraldine Loots who agrees with the above plan to discharge.       Jeannetta Ellis, PA-C 11/15/13 1834

## 2013-11-15 NOTE — ED Notes (Signed)
Pt reports this AM at work had gradually worsening left sided chest "squeezing" that radiated to bilateral arms, associated with nausea and lightheadedness. Pt states episode lasted appx 30 minutes. Denies any complaint at this time. AO x 4.

## 2013-11-16 NOTE — ED Provider Notes (Signed)
  Medical screening examination/treatment/procedure(s) were performed by non-physician practitioner and as supervising physician I was immediately available for consultation/collaboration.  EKG showed sinus rhythm, rate 85, ST elevations diffusely, abnormal    Gerhard Munch, MD 11/16/13 0023

## 2014-05-07 ENCOUNTER — Emergency Department (HOSPITAL_COMMUNITY)
Admission: EM | Admit: 2014-05-07 | Discharge: 2014-05-07 | Disposition: A | Discharge status: Home / Self Care | Payer: Self-pay | Attending: Emergency Medicine | Admitting: Emergency Medicine

## 2014-05-07 ENCOUNTER — Encounter (HOSPITAL_COMMUNITY): Payer: Self-pay | Admitting: Physical Medicine and Rehabilitation

## 2014-05-07 ENCOUNTER — Emergency Department (HOSPITAL_COMMUNITY): Payer: Self-pay

## 2014-05-07 DIAGNOSIS — Y998 Other external cause status: Secondary | ICD-10-CM | POA: Insufficient documentation

## 2014-05-07 DIAGNOSIS — R079 Chest pain, unspecified: Secondary | ICD-10-CM | POA: Insufficient documentation

## 2014-05-07 DIAGNOSIS — G40909 Epilepsy, unspecified, not intractable, without status epilepticus: Secondary | ICD-10-CM | POA: Insufficient documentation

## 2014-05-07 DIAGNOSIS — Y9389 Activity, other specified: Secondary | ICD-10-CM | POA: Insufficient documentation

## 2014-05-07 DIAGNOSIS — R791 Abnormal coagulation profile: Secondary | ICD-10-CM | POA: Insufficient documentation

## 2014-05-07 DIAGNOSIS — Y92009 Unspecified place in unspecified non-institutional (private) residence as the place of occurrence of the external cause: Secondary | ICD-10-CM | POA: Insufficient documentation

## 2014-05-07 DIAGNOSIS — Z791 Long term (current) use of non-steroidal anti-inflammatories (NSAID): Secondary | ICD-10-CM | POA: Insufficient documentation

## 2014-05-07 DIAGNOSIS — R7889 Finding of other specified substances, not normally found in blood: Secondary | ICD-10-CM

## 2014-05-07 DIAGNOSIS — Z23 Encounter for immunization: Secondary | ICD-10-CM | POA: Insufficient documentation

## 2014-05-07 DIAGNOSIS — W228XXA Striking against or struck by other objects, initial encounter: Secondary | ICD-10-CM | POA: Insufficient documentation

## 2014-05-07 DIAGNOSIS — Z9114 Patient's other noncompliance with medication regimen: Secondary | ICD-10-CM | POA: Insufficient documentation

## 2014-05-07 DIAGNOSIS — R52 Pain, unspecified: Secondary | ICD-10-CM

## 2014-05-07 DIAGNOSIS — R569 Unspecified convulsions: Secondary | ICD-10-CM

## 2014-05-07 DIAGNOSIS — S0181XA Laceration without foreign body of other part of head, initial encounter: Secondary | ICD-10-CM | POA: Insufficient documentation

## 2014-05-07 DIAGNOSIS — Z720 Tobacco use: Secondary | ICD-10-CM | POA: Insufficient documentation

## 2014-05-07 LAB — CBC
HCT: 44.8 % (ref 39.0–52.0)
HEMOGLOBIN: 14.4 g/dL (ref 13.0–17.0)
MCH: 27 pg (ref 26.0–34.0)
MCHC: 32.1 g/dL (ref 30.0–36.0)
MCV: 83.9 fL (ref 78.0–100.0)
Platelets: 198 10*3/uL (ref 150–400)
RBC: 5.34 MIL/uL (ref 4.22–5.81)
RDW: 13.2 % (ref 11.5–15.5)
WBC: 6.1 10*3/uL (ref 4.0–10.5)

## 2014-05-07 LAB — RAPID URINE DRUG SCREEN, HOSP PERFORMED
Amphetamines: NOT DETECTED
Barbiturates: NOT DETECTED
Benzodiazepines: NOT DETECTED
COCAINE: NOT DETECTED
OPIATES: NOT DETECTED
Tetrahydrocannabinol: POSITIVE — AB

## 2014-05-07 LAB — COMPREHENSIVE METABOLIC PANEL
ALT: 21 U/L (ref 0–53)
ANION GAP: 12 (ref 5–15)
AST: 37 U/L (ref 0–37)
Albumin: 4.3 g/dL (ref 3.5–5.2)
Alkaline Phosphatase: 100 U/L (ref 39–117)
BUN: 5 mg/dL — ABNORMAL LOW (ref 6–23)
CHLORIDE: 106 mmol/L (ref 96–112)
CO2: 21 mmol/L (ref 19–32)
Calcium: 9.3 mg/dL (ref 8.4–10.5)
Creatinine, Ser: 1.15 mg/dL (ref 0.50–1.35)
GFR calc non Af Amer: 87 mL/min — ABNORMAL LOW (ref >90)
Glucose, Bld: 157 mg/dL — ABNORMAL HIGH (ref 70–99)
Potassium: 4.9 mmol/L (ref 3.5–5.1)
SODIUM: 139 mmol/L (ref 135–145)
Total Bilirubin: 0.6 mg/dL (ref 0.3–1.2)
Total Protein: 7.9 g/dL (ref 6.0–8.3)

## 2014-05-07 LAB — TROPONIN I: Troponin I: <0.03 ng/mL (ref <0.031)

## 2014-05-07 LAB — PHENYTOIN LEVEL, TOTAL: PHENYTOIN LVL: 2.5 ug/mL — AB (ref 10.0–20.0)

## 2014-05-07 MED ORDER — PHENYTOIN SODIUM EXTENDED 100 MG PO CAPS: 300.0000 mg | ORAL_CAPSULE | Freq: Every day | ORAL | Status: DC

## 2014-05-07 MED ORDER — TETANUS-DIPHTH-ACELL PERTUSSIS 5-2.5-18.5 LF-MCG/0.5 IM SUSP
0.5000 mL | Freq: Once | INTRAMUSCULAR | Status: AC
  Administered 2014-05-07: 0.5 mL via INTRAMUSCULAR
  Filled 2014-05-07: qty 0.5

## 2014-05-07 MED ORDER — PHENYTOIN SODIUM EXTENDED 100 MG PO CAPS
300.0000 mg | ORAL_CAPSULE | Freq: Once | ORAL | Status: AC
  Administered 2014-05-07: 300 mg via ORAL
  Filled 2014-05-07: qty 3

## 2014-05-07 NOTE — Discharge Instructions (Signed)
It was our pleasure to provide your ER care today - we hope that you feel better.  Take dilantin as prescribed.  No driving, swimming, or operating heavy machinery, until clear to do so by your doctor.  Follow up with neurologist in 1 week - see referral - call office tomorrow to arrange appointment.  Return to ER if worse, new symptoms, recurrent seizures, severe headache, fevers, trouble breathing, other concern.    Seizure, Adult A seizure is abnormal electrical activity in the brain. Seizures usually last from 30 seconds to 2 minutes. There are various types of seizures. Before a seizure, you may have a warning sensation (aura) that a seizure is about to occur. An aura may include the following symptoms:   Fear or anxiety.  Nausea.  Feeling like the room is spinning (vertigo).  Vision changes, such as seeing flashing lights or spots. Common symptoms during a seizure include:  A change in attention or behavior (altered mental status).  Convulsions with rhythmic jerking movements.  Drooling.  Rapid eye movements.  Grunting.  Loss of bladder and bowel control.  Bitter taste in the mouth.  Tongue biting. After a seizure, you may feel confused and sleepy. You may also have an injury resulting from convulsions during the seizure. HOME CARE INSTRUCTIONS   If you are given medicines, take them exactly as prescribed by your health care provider.  Keep all follow-up appointments as directed by your health care provider.  Do not swim or drive or engage in risky activity during which a seizure could cause further injury to you or others until your health care provider says it is OK.  Get adequate rest.  Teach friends and family what to do if you have a seizure. They should:  Lay you on the ground to prevent a fall.  Put a cushion under your head.  Loosen any tight clothing around your neck.  Turn you on your side. If vomiting occurs, this helps keep your airway  clear.  Stay with you until you recover.  Know whether or not you need emergency care. SEEK IMMEDIATE MEDICAL CARE IF:  The seizure lasts longer than 5 minutes.  The seizure is severe or you do not wake up immediately after the seizure.  You have an altered mental status after the seizure.  You are having more frequent or worsening seizures. Someone should drive you to the emergency department or call local emergency services (911 in [place] MAKE SURE YOU:  Understand these instructions.  Will watch your condition.  Will get help right away if you are not doing well or get worse. Document Released: 02/29/2000 Document Revised: 12/22/2012 Document Reviewed: 10/13/2012 [facility] Patient Information 2015 [place]. This information is not intended to replace advice given to you by your health care provider. Make sure you discuss any questions you have with your health care provider.

## 2014-05-07 NOTE — ED Notes (Addendum)
Pt presents to department via [facility] from home for evaluation of seizure. Pt was playing video games this morning, friend reports seizure lasting approximately 2 minutes. Pt struck head on dresser, abrasion to R side of forehead, laceration to L forehead. Pt initially postictal after seizure. 18g LAC. CBG 110. Pt is alert and oriented x4 upon arrival to ED. Currently taking Dilantin at home, reports he missed dose yesterday. c-collar in place upon arrival.

## 2014-05-07 NOTE — ED Provider Notes (Signed)
CSN: [id number]     Arrival date & time 05/07/14  4098 History   First MD Initiated Contact with Patient 05/07/14 [phone]     Chief Complaint  Patient presents with  . Seizures     (Consider location/radiation/quality/duration/timing/severity/associated sxs/prior Treatment) Patient is a 24 y.o. male presenting with seizures. The history is provided by the patient and the EMS personnel.  Seizures pt with hx seizures, c/o having a seizure while playing video games at home this morning. Pt states unsure when last prior seizure was.  Pt unsure how long seizure lasted. No incontinence. Pt denies any recent increased in overall seizure frequency. States compliant w seizure med/dilantin, denies recent change in dose. w seizure, abrasion to left face/forehead. Pt unsure of last tetanus. Pt denies any symptoms prior to seizure onset. No headaches. No palpitations. Post seizure, does note mild pain mid chest. Midline. Worse w palpation chest. No sob. No diaphoresis or nv. Pt states recent health at baseline. Eating/sleeping normally. No recent febrile illness. No recent head injury, or fall. No syncope.      Past Medical History  Diagnosis Date  . Seizures    History reviewed. No pertinent past surgical history. No family history on file. History  Substance Use Topics  . Smoking status: Current Some Day Smoker    Types: Cigarettes  . Smokeless tobacco: Never Used  . Alcohol Use: No    Review of Systems  Constitutional: Negative for fever and chills.  HENT: Negative for sore throat.   Eyes: Negative for pain, redness and visual disturbance.  Respiratory: Negative for shortness of breath.   Cardiovascular: Negative for palpitations and leg swelling.  Gastrointestinal: Negative for vomiting, abdominal pain and diarrhea.  Endocrine: Negative for polyuria.  Genitourinary: Negative for dysuria and flank pain.  Musculoskeletal: Negative for back pain and neck pain.  Skin: Positive for wound.   Neurological: Positive for seizures. Negative for weakness, numbness and headaches.  Hematological: Does not bruise/bleed easily.  Psychiatric/Behavioral: Negative for confusion.      Allergies  Review of patient's allergies indicates no known allergies.  Home Medications   Prior to Admission medications   Medication Sig Start Date End Date Taking? Authorizing Provider  naproxen (NAPROSYN) 500 MG tablet Take 1 tablet (500 mg total) by mouth 2 (two) times daily with a meal. 11/15/13   Jennifer L Piepenbrink, PA-C  phenytoin (DILANTIN) 100 MG ER capsule Take 3 capsules (300 mg total) by mouth at bedtime. 03/20/13   Olivia Mackie, MD   There were no vitals taken for this visit. Physical Exam  Constitutional: He is oriented to person, place, and time. He appears well-developed and well-nourished. No distress.  HENT:  Mouth/Throat: Oropharynx is clear and moist.  1 cm abrasion/v superificial lac to left face/forehead region. No fb seen or felt. No oral/tongue injury.   Eyes: Conjunctivae and EOM are normal. Pupils are equal, round, and reactive to light.  Neck: Normal range of motion. Neck supple. No tracheal deviation present.  No stiffness or rigidity  Cardiovascular: Normal rate, regular rhythm, normal heart sounds and intact distal pulses.  Exam reveals no gallop and no friction rub.   No murmur heard. Pulmonary/Chest: Effort normal and breath sounds normal. No accessory muscle usage. No respiratory distress. He exhibits tenderness.  Abdominal: Soft. Bowel sounds are normal. He exhibits no distension and no mass. There is no tenderness. There is no rebound and no guarding.  Genitourinary:  No cva tenderness  Musculoskeletal: Normal range of motion.  He exhibits no edema or tenderness.  CTLS spine, non tender, aligned, no step off. Good rom bil ext without pain or focal bony tenderness.   Neurological: He is alert and oriented to person, place, and time. No cranial nerve deficit.  Motor  intact bil, stre 5/5. sens intact.   Skin: Skin is warm and dry. No rash noted. He is not diaphoretic.  Psychiatric: He has a normal mood and affect.  Nursing note and vitals reviewed.   ED Course  Procedures (including critical care time) Labs Review  Results for orders placed or performed during the hospital encounter of 05/07/14  CBC  Result Value Ref Range   WBC 6.1 4.0 - 10.5 K/uL   RBC 5.34 4.22 - 5.81 MIL/uL   Hemoglobin 14.4 13.0 - 17.0 g/dL   HCT 16.1 09.6 - 04.5 %   MCV 83.9 78.0 - 100.0 fL   MCH 27.0 26.0 - 34.0 pg   MCHC 32.1 30.0 - 36.0 g/dL   RDW 40.9 81.1 - 91.4 %   Platelets 198 150 - 400 K/uL  Comprehensive metabolic panel  Result Value Ref Range   Sodium 139 135 - 145 mmol/L   Potassium 4.9 3.5 - 5.1 mmol/L   Chloride 106 96 - 112 mmol/L   CO2 21 19 - 32 mmol/L   Glucose, Bld 157 (H) 70 - 99 mg/dL   BUN 5 (L) 6 - 23 mg/dL   Creatinine, Ser 7.82 0.50 - 1.35 mg/dL   Calcium 9.3 8.4 - 95.6 mg/dL   Total Protein 7.9 6.0 - 8.3 g/dL   Albumin 4.3 3.5 - 5.2 g/dL   AST 37 0 - 37 U/L   ALT 21 0 - 53 U/L   Alkaline Phosphatase 100 39 - 117 U/L   Total Bilirubin 0.6 0.3 - 1.2 mg/dL   GFR calc non Af Amer 87 (L) >90 mL/min   GFR calc Af Amer >90 >90 mL/min   Anion gap 12 5 - 15  Phenytoin level, total  Result Value Ref Range   Phenytoin Lvl 2.5 (L) 10.0 - 20.0 ug/mL  Troponin I  Result Value Ref Range   Troponin I <0.03 <0.031 ng/mL   Dg Chest 2 View  05/07/2014   CLINICAL DATA:  Seizure, pain  EXAM: CHEST  2 VIEW  COMPARISON:  11/15/2013  FINDINGS: Cardiomediastinal silhouette is stable. No acute infiltrate or pleural effusion. No pulmonary edema. Bony thorax is unremarkable. Again noted accessory azygos fissure/ lobe. No pneumothorax.  IMPRESSION: No active cardiopulmonary disease.   Electronically Signed   By: Natasha Mead M.D.   On: 05/07/2014 09:32       EKG Interpretation   Date/Time:  Sunday May 07 2014 08:58:53 EST Ventricular Rate:  75 PR  Interval:  136 QRS Duration: 86 QT Interval:  363 QTC Calculation: 405 R Axis:   62 Text Interpretation:  Sinus rhythm Nonspecific ST abnormality Early  repolarization No significant change since last tracing Confirmed by  Miu Chiong  MD, Caryn Bee ([id number]) on 05/07/2014 9:05:53 AM      MDM   Iv ns. Continuous pulse ox and monitor. o2 [place]. Seizure precautions. Labs.  Reviewed nursing notes and prior charts for additional history.   Dilantin level low - discussed w pt  - pt now says he may have missed some doses, and needs new rx.  Dilantin 300 mg po. rx for home.  No cp or sob. Spine nt. abd soft nt. Tolerating po fluids.  Pt currently appears stable  for d/c.  Follow up and return precautions discussed.      Suzi RootsKevin E Ellyanna Holton, MD 05/07/14 [phone]

## 2014-05-07 NOTE — ED Notes (Signed)
Pt resting quietly at the time. Vital signs stable. Seizure pads remain on bed. Family at bedside. No signs of acute distress noted.

## 2015-07-03 ENCOUNTER — Ambulatory Visit: Payer: Self-pay | Admitting: Physician Assistant

## 2015-07-03 ENCOUNTER — Encounter: Payer: Self-pay | Admitting: Physician Assistant

## 2015-07-03 VITALS — BP 110/80 | HR 63 | Temp 97.9°F | Ht 68.0 in | Wt 145.0 lb

## 2015-07-03 DIAGNOSIS — G40909 Epilepsy, unspecified, not intractable, without status epilepticus: Secondary | ICD-10-CM

## 2015-07-03 DIAGNOSIS — H0013 Chalazion right eye, unspecified eyelid: Secondary | ICD-10-CM | POA: Insufficient documentation

## 2015-07-03 DIAGNOSIS — Z13220 Encounter for screening for lipoid disorders: Secondary | ICD-10-CM

## 2015-07-03 DIAGNOSIS — F17210 Nicotine dependence, cigarettes, uncomplicated: Secondary | ICD-10-CM

## 2015-07-03 DIAGNOSIS — Z131 Encounter for screening for diabetes mellitus: Secondary | ICD-10-CM

## 2015-07-03 LAB — GLUCOSE, POCT (MANUAL RESULT ENTRY): POC GLUCOSE: 122 mg/dL — AB (ref 70–99)

## 2015-07-03 LAB — LIPID PANEL
CHOL/HDL RATIO: 3.2 ratio (ref <=5.0)
CHOLESTEROL: 160 mg/dL (ref 125–200)
HDL: 50 mg/dL (ref >=40)
LDL CALC: 95 mg/dL (ref <130)
Triglycerides: 77 mg/dL (ref <150)
VLDL: 15 mg/dL (ref <30)

## 2015-07-03 LAB — HEMOGLOBIN A1C
Hgb A1c MFr Bld: 5.2 % (ref <5.7)
Mean Plasma Glucose: 103 mg/dL

## 2015-07-03 MED ORDER — PHENYTOIN SODIUM EXTENDED 100 MG PO CAPS: 100.0000 mg | ORAL_CAPSULE | Freq: Three times a day (TID) | ORAL | Status: DC

## 2015-07-03 NOTE — Progress Notes (Signed)
BP 110/80 mmHg  Pulse 63  Temp(Src) 97.9 F (36.6 C)  Ht 5\' 8"  (1.727 m)  Wt 145 lb (65.772 kg)  BMI 22.05 kg/m2  SpO2 99%   Subjective:    Patient ID: William Gomez, male    DOB: May 26, 1990, 25 y.o.   MRN: [id number]  HPI: William Gomez is a 25 y.o. male presenting on 07/03/2015 for New Patient (Initial Visit)   HPI   Pt says he hasn't been taking anti-seizure medication for several years until one month ago when he went to [facility].  Marland Kitchen.  [place]- he doesn't remember the doctor's name.      Pt says he went majority of 2016 with no medication.  Pt states he does not drive.  [facility].  Relevant past medical, surgical, family and social history reviewed and updated as indicated. Interim medical history since our last visit reviewed. Allergies and medications reviewed and updated.   Current outpatient prescriptions:  .  naproxen (NAPROSYN) 500 MG tablet, Take 1 tablet (500 mg total) by mouth 2 (two) times daily with a meal., Disp: 20 tablet, Rfl: 0 .  phenytoin (DILANTIN) 100 MG ER capsule, Take 3 capsules (300 mg total) by mouth at bedtime. (Patient not taking: Reported on 07/03/2015), Disp: 90 capsule, Rfl: 0   Review of Systems  Constitutional: Negative for fever, chills, diaphoresis, appetite change, fatigue and unexpected weight change.  HENT: Negative for congestion, dental problem, drooling, ear pain, facial swelling, hearing loss, mouth sores, sneezing, sore throat, trouble swallowing and voice change.   Eyes: Negative for pain, discharge, redness, itching and visual disturbance.  Respiratory: Negative for cough, choking, shortness of breath and wheezing.   Cardiovascular: Negative for chest pain, palpitations and leg swelling.  Gastrointestinal: Negative for vomiting, abdominal pain, diarrhea, constipation and blood in stool.  Endocrine: Negative for cold intolerance, heat intolerance and polydipsia.   Genitourinary: Negative for dysuria, hematuria and decreased urine volume.  Musculoskeletal: Negative for back pain, arthralgias and gait problem.  Skin: Negative for rash.  Allergic/Immunologic: Negative for environmental allergies.  Neurological: Positive for seizures. Negative for syncope, light-headedness and headaches.  Hematological: Negative for adenopathy.  Psychiatric/Behavioral: Negative for suicidal ideas, dysphoric mood and agitation. The patient is not nervous/anxious.     Per HPI unless specifically indicated above     Objective:    BP 110/80 mmHg  Pulse 63  Temp(Src) 97.9 F (36.6 C)  Ht 5\' 8"  (1.727 m)  Wt 145 lb (65.772 kg)  BMI 22.05 kg/m2  SpO2 99%  Wt Readings from Last 3 Encounters:  07/03/15 145 lb (65.772 kg)  11/15/13 142 lb (64.411 kg)  01/29/09 142 lb 14.4 oz (64.819 kg) (30 %*, Z = -0.52)   * Growth percentiles are based on CDC 2-20 Years data.    Physical Exam  Constitutional: He is oriented to person, place, and time. He appears well-developed and well-nourished.  HENT:  Head: Normocephalic and atraumatic.  Mouth/Throat: Oropharynx is clear and moist. No oropharyngeal exudate.  Eyes: Conjunctivae and EOM are normal. Pupils are equal, round, and reactive to light.  Chalazion R upper lid  Neck: Neck supple. No thyromegaly present.  Cardiovascular: Normal rate and regular rhythm.   Pulmonary/Chest: Effort normal and breath sounds normal. He has no wheezes. He has no rales.  Abdominal: Soft. Bowel sounds are normal. He exhibits no mass. There is no hepatosplenomegaly. There is no tenderness.  Musculoskeletal: He  exhibits no edema.  Lymphadenopathy:    He has no cervical adenopathy.  Neurological: He is alert and oriented to person, place, and time.  Skin: Skin is warm and dry. No rash noted.  Psychiatric: He has a normal mood and affect. His behavior is normal. Thought content normal.  Vitals reviewed.    Results for orders placed or  performed in visit on 07/03/15  POCT Glucose (CBG)  Result Value Ref Range   POC Glucose 122 (A) 70 - 99 mg/dl      Assessment & Plan:   Encounter Diagnoses  Name Primary?  . Seizure disorder (HCC) Yes  . Chalazion of right eye   . Cigarette nicotine dependence without complication   . Screening for diabetes mellitus   . Screening cholesterol level     -Get fasting labs -pt given [facility] application for Referral to  Neurology. Pt given rx dilantin until he can get in with neurology -urged pt to Stop playing video games as they can cause him to have seizure -counseled on Smoking cessation -F/u 1 month

## 2015-07-09 ENCOUNTER — Encounter: Payer: Self-pay | Admitting: Student

## 2015-07-09 ENCOUNTER — Telehealth: Payer: Self-pay | Admitting: Student

## 2015-07-09 NOTE — Telephone Encounter (Signed)
Called pt in reference to referral notes added stating: "Called pt to sched this apt and pt has declined for right now and said he w/ call back at a later time if he decides to sched/[id number]-bws" pt was advised that Carollee Her[place]annon, [place] only rx dilantin at OV 07-03-15 so he could have until he was able to see a neurologist. Pt stated he refused to schedule appt due to not having a valid ID in order to turn in his [facility] application. Pt was advised that a valid ID was only one of the options listed as proof of residency, but there were other documentation types he could turn in along with his application other than ID. Pt was educated on documentation he needs to submit with his [facility] discount application. Pt understood. Pt was advised to submit application ASAP and get appt with neuro scheduled. Pt understood.

## 2015-07-09 NOTE — Telephone Encounter (Signed)
error 

## 2015-07-20 ENCOUNTER — Encounter: Payer: Self-pay | Admitting: Physician Assistant

## 2015-08-02 ENCOUNTER — Ambulatory Visit: Payer: Self-pay | Admitting: Physician Assistant

## 2015-08-06 ENCOUNTER — Encounter: Payer: Self-pay | Admitting: Physician Assistant

## 2015-08-06 ENCOUNTER — Ambulatory Visit: Payer: Self-pay | Admitting: Physician Assistant

## 2015-08-06 VITALS — BP 116/76 | HR 64 | Temp 98.1°F | Ht 68.0 in | Wt 145.0 lb

## 2015-08-06 DIAGNOSIS — F17210 Nicotine dependence, cigarettes, uncomplicated: Secondary | ICD-10-CM

## 2015-08-06 DIAGNOSIS — G40909 Epilepsy, unspecified, not intractable, without status epilepticus: Secondary | ICD-10-CM

## 2015-08-06 NOTE — Patient Instructions (Signed)
Smoking Cessation, Tips for Success If you are ready to quit smoking, congratulations! You have chosen to help yourself be healthier. Cigarettes bring nicotine, tar, carbon monoxide, and other irritants into your body. Your lungs, heart, and blood vessels will be able to work better without these poisons. There are many different ways to quit smoking. Nicotine gum, nicotine patches, a nicotine inhaler, or nicotine nasal spray can help with physical craving. Hypnosis, support groups, and medicines help break the habit of smoking. WHAT THINGS CAN I DO TO MAKE QUITTING EASIER?  Here are some tips to help you quit for good:  Pick a date when you will quit smoking completely. Tell all of your friends and family about your plan to quit on that date.  Do not try to slowly cut down on the number of cigarettes you are smoking. Pick a quit date and quit smoking completely starting on that day.  Throw away all cigarettes.   Clean and remove all ashtrays from your home, work, and car.  On a card, write down your reasons for quitting. Carry the card with you and read it when you get the urge to smoke.  Cleanse your body of nicotine. Drink enough water and fluids to keep your urine clear or pale yellow. Do this after quitting to flush the nicotine from your body.  Learn to predict your moods. Do not let a bad situation be your excuse to have a cigarette. Some situations in your life might tempt you into wanting a cigarette.  Never have "just one" cigarette. It leads to wanting another and another. Remind yourself of your decision to quit.  Change habits associated with smoking. If you smoked while driving or when feeling stressed, try other activities to replace smoking. Stand up when drinking your coffee. Brush your teeth after eating. Sit in a different chair when you read the paper. Avoid alcohol while trying to quit, and try to drink fewer caffeinated beverages. Alcohol and caffeine may urge you to  smoke.  Avoid foods and drinks that can trigger a desire to smoke, such as sugary or spicy foods and alcohol.  Ask people who smoke not to smoke around you.  Have something planned to do right after eating or having a cup of coffee. For example, plan to take a walk or exercise.  Try a relaxation exercise to calm you down and decrease your stress. Remember, you may be tense and nervous for the first 2 weeks after you quit, but this will pass.  Find new activities to keep your hands busy. Play with a pen, coin, or rubber band. Doodle or draw things on paper.  Brush your teeth right after eating. This will help cut down on the craving for the taste of tobacco after meals. You can also try mouthwash.   Use oral substitutes in place of cigarettes. Try using lemon drops, carrots, cinnamon sticks, or chewing gum. Keep them handy so they are available when you have the urge to smoke.  When you have the urge to smoke, try deep breathing.  Designate your home as a nonsmoking area.  If you are a heavy smoker, ask your health care provider about a prescription for nicotine chewing gum. It can ease your withdrawal from nicotine.  Reward yourself. Set aside the cigarette money you save and buy yourself something nice.  Look for support from others. Join a support group or smoking cessation program. Ask someone at home or at work to help you with your plan   to quit smoking.  Always ask yourself, "Do I need this cigarette or is this just a reflex?" Tell yourself, "Today, I choose not to smoke," or "I do not want to smoke." You are reminding yourself of your decision to quit.  Do not replace cigarette smoking with electronic cigarettes (commonly called e-cigarettes). The safety of e-cigarettes is unknown, and some may contain harmful chemicals.  If you relapse, do not give up! Plan ahead and think about what you will do the next time you get the urge to smoke. HOW WILL I FEEL WHEN I QUIT SMOKING? You  may have symptoms of withdrawal because your body is used to nicotine (the addictive substance in cigarettes). You may crave cigarettes, be irritable, feel very hungry, cough often, get headaches, or have difficulty concentrating. The withdrawal symptoms are only temporary. They are strongest when you first quit but will go away within 10-14 days. When withdrawal symptoms occur, stay in control. Think about your reasons for quitting. Remind yourself that these are signs that your body is healing and getting used to being without cigarettes. Remember that withdrawal symptoms are easier to treat than the major diseases that smoking can cause.  Even after the withdrawal is over, expect periodic urges to smoke. However, these cravings are generally short lived and will go away whether you smoke or not. Do not smoke! WHAT RESOURCES ARE AVAILABLE TO HELP ME QUIT SMOKING? Your health care provider can direct you to community resources or hospitals for support, which may include:  Group support.  Education.  Hypnosis.  Therapy.   This information is not intended to replace advice given to you by your health care provider. Make sure you discuss any questions you have with your health care provider.   Document Released: 11/30/2003 Document Revised: 03/24/2014 Document Reviewed: 08/19/2012 Elsevier Interactive Patient Education 2016 Elsevier Inc.  

## 2015-08-06 NOTE — Progress Notes (Signed)
BP 116/76 mmHg  Pulse 64  Temp(Src) 98.1 F (36.7 C)  Ht  (1.727 m)  Wt 145 lb (65.772 kg)  BMI 22.05 kg/m2  SpO2 99%   Subjective:    Patient ID: William Gomez, male    DOB: [birth year], 25 y.o.   MRN: [id number]  HPI: William Gomez is a 25 y.o. male presenting on 08/06/2015 for Seizures   HPI  Previous interaction with nurse noted. (telephone 07/09/15) Pt today states that he is working on getting his [facility] discount application turned in.  Discussed with pt that he needs to see the neurologist for his seizure disorder that we cannot manage his dilantin indefinitely.   Relevant past medical, surgical, family and social history reviewed and updated as indicated. Interim medical history since our last visit reviewed. Allergies and medications reviewed and updated.   Current outpatient prescriptions:  .  phenytoin (DILANTIN) 100 MG ER capsule, Take 1 capsule (100 mg total) by mouth 3 (three) times daily., Disp: 90 capsule, Rfl: 1   Review of Systems  Constitutional: Negative for fever, chills, diaphoresis, appetite change, fatigue and unexpected weight change.  HENT: Negative for congestion, dental problem, drooling, ear pain, facial swelling, hearing loss, mouth sores, sneezing, sore throat, trouble swallowing and voice change.   Eyes: Negative for pain, discharge, redness, itching and visual disturbance.  Respiratory: Negative for cough, choking, shortness of breath and wheezing.   Cardiovascular: Negative for chest pain, palpitations and leg swelling.  Gastrointestinal: Negative for vomiting, abdominal pain, diarrhea, constipation and blood in stool.  Endocrine: Negative for cold intolerance, heat intolerance and polydipsia.  Genitourinary: Negative for dysuria, hematuria and decreased urine volume.  Musculoskeletal: Negative for back pain, arthralgias and gait problem.  Skin: Negative for rash.  Allergic/Immunologic: Negative for environmental allergies.   Neurological: Positive for seizures. Negative for syncope, light-headedness and headaches.  Hematological: Negative for adenopathy.  Psychiatric/Behavioral: Negative for suicidal ideas, dysphoric mood and agitation. The patient is not nervous/anxious.     Per HPI unless specifically indicated above     Objective:    BP 116/76 mmHg  Pulse 64  Temp(Src) 98.1 F (36.7 C)  Ht  (1.727 m)  Wt 145 lb (65.772 kg)  BMI 22.05 kg/m2  SpO2 99%  Wt Readings from Last 3 Encounters:  08/06/15 145 lb (65.772 kg)  07/03/15 145 lb (65.772 kg)  11/15/13 142 lb (64.411 kg)    Physical Exam  Constitutional: He is oriented to person, place, and time. He appears well-developed and well-nourished.  HENT:  Head: Normocephalic and atraumatic.  Neck: Neck supple.  Cardiovascular: Normal rate and regular rhythm.   Pulmonary/Chest: Effort normal and breath sounds normal. He has no wheezes.  Abdominal: Soft. Bowel sounds are normal. There is no hepatosplenomegaly. There is no tenderness.  Musculoskeletal: He exhibits no edema.  Lymphadenopathy:    He has no cervical adenopathy.  Neurological: He is alert and oriented to person, place, and time.  Skin: Skin is warm and dry.  Psychiatric: He has a normal mood and affect. His behavior is normal.  Vitals reviewed.   Results for orders placed or performed in visit on 07/03/15  HgB A1c  Result Value Ref Range   Hgb A1c MFr Bld 5.2 <5.7 %   Mean Plasma Glucose 103 mg/dL  Lipid Profile  Result Value Ref Range   Cholesterol 160 125 - 200 mg/dL   Triglycerides 77 <272 mg/dL   HDL 50 >=53 mg/dL   Total CHOL/HDL  Ratio 3.2 <=5.0 Ratio   VLDL 15 <30 mg/dL   LDL Cholesterol 95 <161<130 mg/dL  POCT Glucose (CBG)  Result Value Ref Range   POC Glucose 122 (A) 70 - 99 mg/dl      Assessment & Plan:   Encounter Diagnoses  Name Primary?  . Seizure disorder (HCC) Yes  . Cigarette nicotine dependence without complication     -reviewed labs with pt   -counseled on smoking cessation -discussed with pt that marijuana can lower seizure threshold so he should avoid smoking marijuana -pt to notify office when he turns in his [facility] application so we can arrange for his neurology appointment -f/u 6 months.  RTO sooner pnr

## 2016-02-05 ENCOUNTER — Ambulatory Visit: Payer: Self-pay | Admitting: Physician Assistant

## 2016-02-12 ENCOUNTER — Encounter: Payer: Self-pay | Admitting: Physician Assistant

## 2016-03-03 ENCOUNTER — Emergency Department (HOSPITAL_COMMUNITY)
Admission: EM | Admit: 2016-03-03 | Discharge: 2016-03-03 | Disposition: A | Discharge status: Home / Self Care | Payer: Self-pay | Attending: Emergency Medicine | Admitting: Emergency Medicine

## 2016-03-03 ENCOUNTER — Encounter (HOSPITAL_COMMUNITY): Payer: Self-pay | Admitting: Pharmacy Technician

## 2016-03-03 DIAGNOSIS — R791 Abnormal coagulation profile: Secondary | ICD-10-CM | POA: Insufficient documentation

## 2016-03-03 DIAGNOSIS — R569 Unspecified convulsions: Secondary | ICD-10-CM

## 2016-03-03 DIAGNOSIS — G40909 Epilepsy, unspecified, not intractable, without status epilepticus: Secondary | ICD-10-CM | POA: Insufficient documentation

## 2016-03-03 DIAGNOSIS — R21 Rash and other nonspecific skin eruption: Secondary | ICD-10-CM | POA: Insufficient documentation

## 2016-03-03 DIAGNOSIS — F17290 Nicotine dependence, other tobacco product, uncomplicated: Secondary | ICD-10-CM | POA: Insufficient documentation

## 2016-03-03 LAB — COMPREHENSIVE METABOLIC PANEL
ALK PHOS: 59 U/L (ref 38–126)
ALT: 13 U/L — ABNORMAL LOW (ref 17–63)
AST: 30 U/L (ref 15–41)
Albumin: 4.2 g/dL (ref 3.5–5.0)
Anion gap: 8 (ref 5–15)
BILIRUBIN TOTAL: 0.7 mg/dL (ref 0.3–1.2)
BUN: 6 mg/dL (ref 6–20)
CALCIUM: 9.3 mg/dL (ref 8.9–10.3)
CO2: 25 mmol/L (ref 22–32)
Chloride: 106 mmol/L (ref 101–111)
Creatinine, Ser: 1.12 mg/dL (ref 0.61–1.24)
GFR calc non Af Amer: >60 mL/min (ref >60)
Glucose, Bld: 90 mg/dL (ref 65–99)
POTASSIUM: 4.3 mmol/L (ref 3.5–5.1)
SODIUM: 139 mmol/L (ref 135–145)
TOTAL PROTEIN: 7.1 g/dL (ref 6.5–8.1)

## 2016-03-03 LAB — CBC WITH DIFFERENTIAL/PLATELET
BASOS ABS: 0 10*3/uL (ref 0.0–0.1)
BASOS PCT: 0 %
Eosinophils Absolute: 0 10*3/uL (ref 0.0–0.7)
Eosinophils Relative: 1 %
HEMATOCRIT: 44.1 % (ref 39.0–52.0)
HEMOGLOBIN: 14.4 g/dL (ref 13.0–17.0)
LYMPHS PCT: 15 %
Lymphs Abs: 1 10*3/uL (ref 0.7–4.0)
MCH: 27.1 pg (ref 26.0–34.0)
MCHC: 32.7 g/dL (ref 30.0–36.0)
MCV: 82.9 fL (ref 78.0–100.0)
Monocytes Absolute: 0.8 10*3/uL (ref 0.1–1.0)
Monocytes Relative: 11 %
NEUTROS ABS: 5.1 10*3/uL (ref 1.7–7.7)
NEUTROS PCT: 73 %
Platelets: 203 10*3/uL (ref 150–400)
RBC: 5.32 MIL/uL (ref 4.22–5.81)
RDW: 13.3 % (ref 11.5–15.5)
WBC: 7 10*3/uL (ref 4.0–10.5)

## 2016-03-03 LAB — PROTIME-INR
INR: 1.12
PROTHROMBIN TIME: 14.4 s (ref 11.4–15.2)

## 2016-03-03 LAB — PHENYTOIN LEVEL, TOTAL: Phenytoin Lvl: <2.5 ug/mL — ABNORMAL LOW (ref 10.0–20.0)

## 2016-03-03 MED ORDER — SODIUM CHLORIDE 0.9 % IV SOLN
1000.0000 mg | Freq: Once | INTRAVENOUS | Status: AC
  Administered 2016-03-03: 1000 mg via INTRAVENOUS
  Filled 2016-03-03: qty 20

## 2016-03-03 MED ORDER — ACETAMINOPHEN 500 MG PO TABS
1000.0000 mg | ORAL_TABLET | Freq: Once | ORAL | Status: AC
  Administered 2016-03-03: 1000 mg via ORAL
  Filled 2016-03-03: qty 2

## 2016-03-03 MED ORDER — PHENYTOIN SODIUM EXTENDED 100 MG PO CAPS: 100.0000 mg | ORAL_CAPSULE | Freq: Three times a day (TID) | ORAL | 1 refills | Status: DC

## 2016-03-03 NOTE — ED Notes (Signed)
ED Provider at bedside. 

## 2016-03-03 NOTE — ED Notes (Signed)
Burna MortimerWanda with Case Management at bedside talking to pt

## 2016-03-03 NOTE — Discharge Instructions (Signed)
Read the information below.  Use the prescribed medication as directed.  Please discuss all new medications with your pharmacist.  You may return to the Emergency Department at any time for worsening condition or any new symptoms that concern you.    It is important that you take your medication as prescribed. Please follow up at the appointment that has been set up for you to establish with primary care.

## 2016-03-03 NOTE — ED Triage Notes (Signed)
Pt arrives via [facility] with complaints of witnessed seizure lasting 2-3 minutes. Per ems pt has a hx of seizures and is non compliant with medications. Per ems pt post ictal upon their arrival. Pt fell of of bed and hit head on nightstand. Pt noted to have dried blood to L side of face.

## 2016-03-03 NOTE — Care Management Note (Signed)
Case Management Note  Patient Details  Name: William Gomez MRN: [id number] Date of Birth: [birth year]  Subjective/Objective:            [facility] with  Seizure, grand mal. Patient states he has not been taking medication because he is not able to afford his Dilantin.  Patient also reports not having health insurance or a PCP   Action/Plan: CM spoke with patient and discussed the [facility] [place], patient is agreeable to establishing care at the clinic. CED CM sent in basket message to the scheduler at the clinic to reach out to patient to establish care. [facility] CM discussed MATCH program and guidelines, patient verbalized  understanding and is agreeable to accept the assistance. Patient enrolled letter printed and given to patient with the instruction on how to redeem. No furtjher question or concerns verbalized.  Updated EDP on discharge plan.   Expected Discharge Date:      03/03/2016            Expected Discharge Plan:  Home/Self Care  In-House Referral:  NA  Discharge planning Services  CM Consult, Follow-up appt scheduled, MATCH Program  Post Acute Care Choice:  NA Choice offered to:  Patient  DME Arranged:  N/A DME Agency:  NA  HH Arranged:  NA HH Agency:  NA  Status of Service:  Completed, signed off  If discussed at Long Length of Stay Meetings, dates discussed:    Additional CommentsMichel Gomez:  William Pearce, RN 03/03/2016, 7:32 PM

## 2016-03-03 NOTE — ED Notes (Signed)
Placed patient into a gown and on the monitor did ekg 

## 2016-03-03 NOTE — ED Provider Notes (Signed)
[facility]EM[facility]ENCY DEPT Provider Note   CSN: [id number] Arrival date & time: 03/03/16  1617     History   Chief Complaint Chief Complaint  Patient presents with  . Seizures    HPI William Gomez is a 25 y.o. male.  HPI   Pt with hx seizure presents after witnessed seizure today.  States he was hanging out with his friend when it happened.  Does not remember it occurring.  Does have blood coming from left nare.  Denies any pain.  Reports he is "feeling better" currently.  Reports he does take his medications.  No recent illness or change in activity.  Is eating and drinking well.    Past Medical History:  Diagnosis Date  . 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 Cigarette nicotine dependence without complication 07/03/2015  . Seizure disorder (HCC) 01/29/2009  . INSOMNIA-SLEEP DISORDER-UNSPEC 01/29/2009    History reviewed. No pertinent surgical history.     Home Medications    Prior to Admission medications   Medication Sig Start Date End Date Taking? Authorizing Provider  phenytoin (DILANTIN) 100 MG ER capsule Take 1 capsule (100 mg total) by mouth 3 (three) times daily. 03/03/16   Trixie DredgeEmily Yoniel Arkwright, PA-C    Family History Family History  Problem Relation Age of Onset  . Hypertension Mother   . Hypertension Father   . Cancer Paternal Grandfather     Social History Social History  Substance Use Topics  . Smoking status: Current Some Day Smoker    Packs/day: 0.30    Years: 1.00    Types: Cigars  . Smokeless tobacco: Never Used  . Alcohol use 0.0 oz/week     Comment: occasionally drinks beersocially     Allergies   Patient has no known allergies.   Review of Systems Review of Systems  All other systems reviewed and are negative.    Physical Exam Updated Vital Signs BP 120/85   Pulse 90   Temp 98.2 F (36.8 C) (Oral)   Resp 16   Ht 5\' 7"  (1.702 m)   Wt 65.8 kg    SpO2 99%   BMI 22.71 kg/m   Physical Exam  Constitutional: He appears well-developed and well-nourished. No distress.  HENT:  Head: Normocephalic.  Left nare with moderate dried blood.  No active bleeding.   Neck: Neck supple.  Cardiovascular: Normal rate and regular rhythm.   Pulmonary/Chest: Effort normal and breath sounds normal. No respiratory distress. He has no wheezes. He has no rales.  Abdominal: Soft. He exhibits no distension and no mass. There is no tenderness. There is no rebound and no guarding.  Neurological: He exhibits normal muscle tone.  Awake but sleepy.  Conversational.  Hazy on some details such as his medication name and doctor's name, suspect continued mild post-ictal state.   Moves all extremities normally, grip strengths equal bilaterally.    Skin: He is not diaphoretic.  Nonblanching nontender petechial rash over left anterior shoulder and chest.     Nursing note and vitals reviewed.    ED Treatments / Results  Labs (all labs ordered are listed, but only abnormal results are displayed) Labs Reviewed  COMPREHENSIVE METABOLIC PANEL - Abnormal; Notable for the following:       Result Value   ALT 13 (*)    All other components within normal limits  PHENYTOIN LEVEL, TOTAL - Abnormal; Notable for  the following:    Phenytoin Lvl <2.5 (*)    All other components within normal limits  CBC WITH DIFFERENTIAL/PLATELET  PROTIME-INR    EKG  EKG Interpretation  Date/Time:  Monday March 03 2016 16:25:42 EST Ventricular Rate:  94 PR Interval:    QRS Duration: 81 QT Interval:  333 QTC Calculation: 417 R Axis:   71 Text Interpretation:  Sinus rhythm RSR' in V1 or V2, probably normal variant No significant change since last tracing Confirmed by Rubin PayorPICKERING  MD, Harrold DonathNATHAN [phone] on 03/03/2016 7:24:15 PM       Radiology No results found.  Procedures Procedures (including critical care time)  Medications Ordered in ED Medications  phenytoin  (DILANTIN) 1,000 mg in sodium chloride 0.9 % 250 mL IVPB (1,000 mg Intravenous New Bag/Given 03/03/16 1950)  acetaminophen (TYLENOL) tablet 1,000 mg (1,000 mg Oral Given 03/03/16 2001)     Initial Impression / Assessment and Plan / ED Course  I have reviewed the triage vital signs and the nursing notes.  Pertinent labs & imaging results that were available during my care of the patient were reviewed by me and considered in my medical decision making (see chart for details).  Clinical Course as of Mar 04 2011  Mon Mar 03, 2016  1920 Family now bedside.  State pt is back to normal.  Has been out of his dilantin because he has no primary care provider.  Anola GurneyWanda Rogers, case manager, has offered $3 match for medications and is setting up primary care appointment.   Pt reports he does have a headache but it is improving.    [EW]    Clinical Course User Index [EW] Trixie DredgeEmily Shalice Woodring, PA-C    Afebrile, nontoxic patient with hx seizure disorder on dilantin with witnessed seizure today.  Pt did hit his head, had left epistaxis that resolved, pt denies any pain.  Did develop a mild headache that began improving without treatment, doubt intracranial hemorrhage.  Has been off of his medication.  Returned to baseline, no recurrent seizure.  D/C home with dilantin, PCP follow up.   Discussed result, findings, treatment, and follow up  with patient.  Pt given return precautions.  Pt verbalizes understanding and agrees with plan.       Final Clinical Impressions(s) / ED Diagnoses   Final diagnoses:  Seizure [facility](HCC)    New Prescriptions Current Discharge Medication List       Trixie Dredgemily Toniesha Zellner, [place]-C 03/03/16 2013    Benjiman CoreNathan Pickering, MD 03/04/16 [phone]

## 2016-03-12 ENCOUNTER — Ambulatory Visit: Payer: Self-pay | Admitting: Physician Assistant

## 2016-03-12 ENCOUNTER — Encounter: Payer: Self-pay | Admitting: Physician Assistant

## 2016-03-12 VITALS — BP 120/64 | HR 95 | Temp 98.1°F | Ht 68.0 in | Wt 155.0 lb

## 2016-03-12 DIAGNOSIS — G40909 Epilepsy, unspecified, not intractable, without status epilepticus: Secondary | ICD-10-CM

## 2016-03-12 NOTE — Progress Notes (Signed)
BP 120/64 (BP Location: Left Arm, Patient Position: Sitting, Cuff Size: Normal)   Pulse 95   Temp 98.1 F (36.7 C)   Ht 5\' 8"  (1.727 m)   Wt 155 lb (70.3 kg)   SpO2 99%   BMI 23.57 kg/m    Subjective:    Patient ID: William Gomez, male    DOB: 08-13-1990, 25 y.o.   MRN: [id number]  HPI: William Molentonio J Friesenhahn is a 25 y.o. male presenting on 03/12/2016 for Follow-up (from ER)   HPI   Pt says he turned in his cone discount application in march or April.  His last appt here was end of may and he still hadn't turned it in.  So he hasn't seen neurologist yet.   He says no injuries with recent seizure.  He says he is no longer smoking marijuana.   Relevant past medical, surgical, family and social history reviewed and updated as indicated. Interim medical history since our last visit reviewed. Allergies and medications reviewed and updated.   Current Outpatient Prescriptions:  .  phenytoin (DILANTIN) 100 MG ER capsule, Take 1 capsule (100 mg total) by mouth 3 (three) times daily., Disp: 90 capsule, Rfl: 1   Review of Systems  Constitutional: Negative for appetite change, chills, diaphoresis, fatigue, fever and unexpected weight change.  HENT: Negative for congestion, dental problem, drooling, ear pain, facial swelling, hearing loss, mouth sores, sneezing, sore throat, trouble swallowing and voice change.   Eyes: Negative for pain, discharge, redness, itching and visual disturbance.  Respiratory: Negative for cough, choking, shortness of breath and wheezing.   Cardiovascular: Negative for chest pain, palpitations and leg swelling.  Gastrointestinal: Negative for abdominal pain, blood in stool, constipation, diarrhea and vomiting.  Endocrine: Negative for cold intolerance, heat intolerance and polydipsia.  Genitourinary: Negative for decreased urine volume, dysuria and hematuria.  Musculoskeletal: Negative for arthralgias, back pain and gait problem.  Skin: Negative for rash.   Allergic/Immunologic: Negative for environmental allergies.  Neurological: Positive for seizures. Negative for syncope, light-headedness and headaches.  Hematological: Negative for adenopathy.  Psychiatric/Behavioral: Negative for agitation, dysphoric mood and suicidal ideas. The patient is not nervous/anxious.     Per HPI unless specifically indicated above     Objective:    BP 120/64 (BP Location: Left Arm, Patient Position: Sitting, Cuff Size: Normal)   Pulse 95   Temp 98.1 F (36.7 C)   Ht 5\' 8"  (1.727 m)   Wt 155 lb (70.3 kg)   SpO2 99%   BMI 23.57 kg/m   Wt Readings from Last 3 Encounters:  03/12/16 155 lb (70.3 kg)  03/03/16 145 lb (65.8 kg)  08/06/15 145 lb (65.8 kg)    Physical Exam  Constitutional: He is oriented to person, place, and time. He appears well-developed and well-nourished.  HENT:  Head: Normocephalic and atraumatic.  Neck: Neck supple.  Cardiovascular: Normal rate and regular rhythm.   Pulmonary/Chest: Effort normal and breath sounds normal. He has no wheezes.  Abdominal: Soft. Bowel sounds are normal. There is no hepatosplenomegaly. There is no tenderness.  Musculoskeletal: He exhibits no edema.  Lymphadenopathy:    He has no cervical adenopathy.  Neurological: He is alert and oriented to person, place, and time. He has normal strength. No sensory deficit. Coordination and gait normal.  Skin: Skin is warm and dry.  Psychiatric: He has a normal mood and affect. His behavior is normal.  Vitals reviewed.       Assessment & Plan:  Encounter Diagnosis  Name Primary?  . Seizure disorder (HCC) Yes    -nurse called Financial Counselor at [facility] who is on vacation this week. Message left for her to contact us in reference to Mr Perkin's cone discount application -pt will follow up in one month to make sure he has appointment with neurologist

## 2016-03-18 ENCOUNTER — Telehealth: Payer: Self-pay

## 2016-03-18 NOTE — Telephone Encounter (Signed)
RN was sent a message via EPIC in regards to this patient who was [facility] and stated he had no Primary Care Provider. Case manager Michel BickersWandalyn Rogers contacted RN to arrange follow up and to establish care [facility]. *Note [id number] is not a primary Care office* RN called case manager and left message in order to follow up. RN in reviewing past notes found that [facility] for this patient. [facility] and [facility] stated they will call patient to follow up and schedule follow up post ER visit. 03/05/16 RN spoke by phone with case manager at [facility] Wanda Rogers and informed her of scope of practice for [facility] as well as that RN had contacted [facility] to follow up for primary care for patient. 03/12/16  RN received call from [facility] that this patient had been seen for follow up for post ER visit and the [facility] is continuing to provide primary care for this patient at this time.

## 2016-03-18 NOTE — Telephone Encounter (Signed)
[facility] to let RN know that Clinic had been in contact with patient and follow up care post ER visit had been completed and they were continuing primary care for this patient at this time.

## 2016-04-14 ENCOUNTER — Ambulatory Visit: Payer: Self-pay | Admitting: Physician Assistant

## 2016-04-16 ENCOUNTER — Ambulatory Visit: Payer: Self-pay | Admitting: Neurology

## 2016-04-24 ENCOUNTER — Ambulatory Visit: Payer: Self-pay | Admitting: Physician Assistant

## 2016-05-06 ENCOUNTER — Ambulatory Visit: Payer: Self-pay | Admitting: Neurology

## 2016-05-07 ENCOUNTER — Encounter: Payer: Self-pay | Admitting: Neurology

## 2016-05-08 ENCOUNTER — Ambulatory Visit: Payer: Self-pay | Admitting: Physician Assistant

## 2016-05-22 ENCOUNTER — Ambulatory Visit: Payer: Self-pay | Admitting: Physician Assistant

## 2016-05-22 VITALS — BP 100/74 | HR 74 | Temp 98.6°F | Wt 146.5 lb

## 2016-05-22 DIAGNOSIS — G40909 Epilepsy, unspecified, not intractable, without status epilepticus: Secondary | ICD-10-CM

## 2016-05-22 NOTE — Progress Notes (Signed)
BP 100/74 (BP Location: Left Arm, Patient Position: Sitting, Cuff Size: Normal)   Pulse 74   Temp 98.6 F (37 C)   Wt 146 lb 8 oz (66.5 kg)   SpO2 99%   BMI 22.28 kg/m    Subjective:    Patient ID: William Gomez, male    DOB: Jan 06, 1991, 26 y.o.   MRN: [id number]  HPI: William Gomez is a 26 y.o. male presenting on 05/22/2016 for No chief complaint on file.   HPI   Pt says he didn't go to neurology appointment because he didn't have $200.  He says he never heard on his cone discount applicaiton  Pt states last seizure was in December  Pt has cancelled 3 appointments here at [facility] scheduled for follow up  Relevant past medical, surgical, family and social history reviewed and updated as indicated. Interim medical history since our last visit reviewed. Allergies and medications reviewed and updated.   Current Outpatient Prescriptions:  .  phenytoin (DILANTIN) 100 MG ER capsule, Take 1 capsule (100 mg total) by mouth 3 (three) times daily., Disp: 90 capsule, Rfl: 1   Review of Systems  Constitutional: Negative for appetite change, chills, diaphoresis, fatigue, fever and unexpected weight change.  HENT: Negative for congestion, dental problem, drooling, ear pain, facial swelling, hearing loss, mouth sores, sneezing, sore throat, trouble swallowing and voice change.   Eyes: Negative for pain, discharge, redness, itching and visual disturbance.  Respiratory: Negative for cough, choking, shortness of breath and wheezing.   Cardiovascular: Negative for chest pain, palpitations and leg swelling.  Gastrointestinal: Negative for abdominal pain, blood in stool, constipation, diarrhea and vomiting.  Endocrine: Negative for cold intolerance, heat intolerance and polydipsia.  Genitourinary: Negative for decreased urine volume, dysuria and hematuria.  Musculoskeletal: Negative for arthralgias, back pain and gait problem.  Skin: Negative for rash.  Allergic/Immunologic: Negative for  environmental allergies.  Neurological: Positive for seizures. Negative for syncope, light-headedness and headaches.  Hematological: Negative for adenopathy.  Psychiatric/Behavioral: Negative for agitation, dysphoric mood and suicidal ideas. The patient is not nervous/anxious.     Per HPI unless specifically indicated above     Objective:    BP 100/74 (BP Location: Left Arm, Patient Position: Sitting, Cuff Size: Normal)   Pulse 74   Temp 98.6 F (37 C)   Wt 146 lb 8 oz (66.5 kg)   SpO2 99%   BMI 22.28 kg/m   Wt Readings from Last 3 Encounters:  05/22/16 146 lb 8 oz (66.5 kg)  03/12/16 155 lb (70.3 kg)  03/03/16 145 lb (65.8 kg)    Physical Exam  Constitutional: He is oriented to person, place, and time. He appears well-developed and well-nourished.  HENT:  Head: Normocephalic and atraumatic.  Neck: Neck supple.  Cardiovascular: Normal rate and regular rhythm.   Pulmonary/Chest: Effort normal and breath sounds normal. He has no wheezes.  Abdominal: Soft. Bowel sounds are normal. There is no hepatosplenomegaly. There is no tenderness.  Musculoskeletal: He exhibits no edema.  Lymphadenopathy:    He has no cervical adenopathy.  Neurological: He is alert and oriented to person, place, and time.  Skin: Skin is warm and dry.  Psychiatric: He has a normal mood and affect. His behavior is normal.  Vitals reviewed.       Assessment & Plan:   Encounter Diagnosis  Name Primary?  . Seizure disorder (HCC) Yes    -explained to pt how important it is that he come in for his follow  up appointments so we can make sure that he gets in to be seen by the neurologist.  He states understanding -nurse will check with Morrie SheldonAshley on pt's cone discount application -pt is given information on [employer] to help with his transportation problems -pt to follow up in one month to make sure he is set up to see neurologist

## 2016-06-24 ENCOUNTER — Ambulatory Visit: Payer: Self-pay | Admitting: Physician Assistant

## 2016-06-24 ENCOUNTER — Encounter: Payer: Self-pay | Admitting: Physician Assistant

## 2016-06-24 VITALS — BP 106/82 | HR 72 | Temp 97.7°F | Ht 68.0 in | Wt 149.0 lb

## 2016-06-24 DIAGNOSIS — G40909 Epilepsy, unspecified, not intractable, without status epilepticus: Secondary | ICD-10-CM

## 2016-06-24 MED ORDER — PHENYTOIN SODIUM EXTENDED 100 MG PO CAPS: 100.0000 mg | ORAL_CAPSULE | Freq: Three times a day (TID) | ORAL | 0 refills | Status: DC

## 2016-06-24 NOTE — Progress Notes (Signed)
BP 106/82 (BP Location: Right Arm, Patient Position: Sitting, Cuff Size: Normal)   Pulse 72   Temp 97.7 F (36.5 C)   Ht  (1.727 m)   Wt 149 lb (67.6 kg)   SpO2 99%   BMI 22.66 kg/m    Subjective:    Patient ID: William Gomez, male    DOB: [birth year], 26 y.o.   MRN: [id number]  HPI: William Gomez is a 26 y.o. male presenting on 06/24/2016 for Seizures   HPI Pt says he is doing well.  States no seizures recently.  He is still avoiding driving.   He says he is almost out of his dilantin.  He did not bring his medication with him today.    Nurse sent email to [facility] in reference to pt's cone discount application but never got response and she is no longer with [facility].    Relevant past medical, surgical, family and social history reviewed and updated as indicated. Interim medical history since our last visit reviewed. Allergies and medications reviewed and updated.   Current Outpatient Prescriptions:  .  phenytoin (DILANTIN) 100 MG ER capsule, Take 1 capsule (100 mg total) by mouth 3 (three) times daily., Disp: 90 capsule, Rfl: 1  Review of Systems  Constitutional: Negative for appetite change, chills, diaphoresis, fatigue, fever and unexpected weight change.  HENT: Negative for congestion, dental problem, drooling, ear pain, facial swelling, hearing loss, mouth sores, sneezing, sore throat, trouble swallowing and voice change.   Eyes: Negative for pain, discharge, redness, itching and visual disturbance.  Respiratory: Negative for cough, choking, shortness of breath and wheezing.   Cardiovascular: Negative for chest pain, palpitations and leg swelling.  Gastrointestinal: Negative for abdominal pain, blood in stool, constipation, diarrhea and vomiting.  Endocrine: Negative for cold intolerance, heat intolerance and polydipsia.  Genitourinary: Negative for decreased urine volume, dysuria and hematuria.  Musculoskeletal: Negative for arthralgias, back pain and gait  problem.  Skin: Negative for rash.  Allergic/Immunologic: Negative for environmental allergies.  Neurological: Positive for seizures. Negative for syncope, light-headedness and headaches.  Hematological: Negative for adenopathy.  Psychiatric/Behavioral: Negative for agitation, dysphoric mood and suicidal ideas. The patient is not nervous/anxious.     Per HPI unless specifically indicated above     Objective:    BP 106/82 (BP Location: Right Arm, Patient Position: Sitting, Cuff Size: Normal)   Pulse 72   Temp 97.7 F (36.5 C)   Ht  (1.727 m)   Wt 149 lb (67.6 kg)   SpO2 99%   BMI 22.66 kg/m   Wt Readings from Last 3 Encounters:  06/24/16 149 lb (67.6 kg)  05/22/16 146 lb 8 oz (66.5 kg)  03/12/16 155 lb (70.3 kg)    Physical Exam  Constitutional: He is oriented to person, place, and time. He appears well-developed and well-nourished.  HENT:  Head: Normocephalic and atraumatic.  Neck: Neck supple.  Cardiovascular: Normal rate and regular rhythm.   Pulmonary/Chest: Effort normal and breath sounds normal. He has no wheezes.  Abdominal: Soft. Bowel sounds are normal. There is no hepatosplenomegaly. There is no tenderness.  Musculoskeletal: He exhibits no edema.  Lymphadenopathy:    He has no cervical adenopathy.  Neurological: He is alert and oriented to person, place, and time.  Skin: Skin is warm and dry.  Psychiatric: He has a normal mood and affect. His behavior is normal.  Vitals reviewed.        Assessment & Plan:    Encounter  Diagnosis  Name Primary?  . Seizure disorder (HCC) Yes    -pt is given phone number for [facility] Customer Service and he is to call the number to check on his cone discount application.  He is to notify our office when he finds out so we can get him scheduled with neurology.  Discussed with pt that he really really needs to see the neurologist for his seizures. -pt to get dilantin level drawn -refill dilantin given -follow up one month  to make sure pt has appt with neuro

## 2016-06-25 ENCOUNTER — Ambulatory Visit: Payer: Self-pay | Admitting: Physician Assistant

## 2016-06-25 ENCOUNTER — Telehealth: Payer: Self-pay | Admitting: Physician Assistant

## 2016-06-25 LAB — PHENYTOIN LEVEL, TOTAL: PHENYTOIN LVL: 5.1 ug/mL — AB (ref 10.0–20.0)

## 2016-06-25 NOTE — Telephone Encounter (Signed)
Pt. called in stating he called  Customer Service to find out if he was approved for [facility]. Pt. advised that he was approved several years ago, but not approved at this time. Pt. was asked if he sent in new application and he state not yet. Pt. was  advised to fill out application and mail it in as soon as possible. Pt. was also advised to call [facility] Customer Service back a few day before his follow up appointment to get a status on his application.

## 2016-07-22 ENCOUNTER — Encounter: Payer: Self-pay | Admitting: Physician Assistant

## 2016-07-22 ENCOUNTER — Ambulatory Visit: Payer: Self-pay | Admitting: Physician Assistant

## 2016-07-22 VITALS — BP 110/66 | HR 76 | Temp 98.1°F | Ht 68.0 in | Wt 146.5 lb

## 2016-07-22 DIAGNOSIS — Z91199 Patient's noncompliance with other medical treatment and regimen due to unspecified reason: Secondary | ICD-10-CM

## 2016-07-22 DIAGNOSIS — Z9119 Patient's noncompliance with other medical treatment and regimen: Secondary | ICD-10-CM

## 2016-07-22 DIAGNOSIS — G40909 Epilepsy, unspecified, not intractable, without status epilepticus: Secondary | ICD-10-CM

## 2016-07-22 MED ORDER — PHENYTOIN SODIUM EXTENDED 100 MG PO CAPS: 100.0000 mg | ORAL_CAPSULE | Freq: Three times a day (TID) | ORAL | 0 refills | Status: DC

## 2016-07-22 NOTE — Progress Notes (Signed)
BP 110/66 (BP Location: Left Arm, Patient Position: Sitting, Cuff Size: Normal)   Pulse 76   Temp 98.1 F (36.7 C)   Ht 5\' 8"  (1.727 m)   Wt 146 lb 8 oz (66.5 kg)   SpO2 98%   BMI 22.28 kg/m    Subjective:    Patient ID: William Gomez, male    DOB: 1990/12/30, 26 y.o.   MRN: [id number]  HPI: William Gomez is a 26 y.o. male presenting on 07/22/2016 for Seizures   HPI   Pt has still not submitted his cone discount application  Discussed with pt that we have been seeing him for over a year now and he has not yet gotten done what we told him at his very first appointment that he needed to do.  Pt was reminded to turn in his cone discount application repeatedly at multiple appointments since his initial appointment.   Reminded him that [employer] does not manage seizure disorders.    He has been referred to neurology which is who he needs to be seeing for his seizure disorder.  He states understanding and admits that he should have turned in the application as directed.    Relevant past medical, surgical, family and social history reviewed and updated as indicated. Interim medical history since our last visit reviewed. Allergies and medications reviewed and updated.  Current Outpatient Prescriptions:  .  phenytoin (DILANTIN) 100 MG ER capsule, Take 1 capsule (100 mg total) by mouth 3 (three) times daily., Disp: 90 capsule, Rfl: 0  Review of Systems  Constitutional: Negative for appetite change, chills, diaphoresis, fatigue, fever and unexpected weight change.  HENT: Negative for congestion, dental problem, drooling, ear pain, facial swelling, hearing loss, mouth sores, sneezing, sore throat, trouble swallowing and voice change.   Eyes: Negative for pain, discharge, redness, itching and visual disturbance.  Respiratory: Negative for cough, choking, shortness of breath and wheezing.   Cardiovascular: Negative for chest pain, palpitations and leg swelling.  Gastrointestinal: Negative for  abdominal pain, blood in stool, constipation, diarrhea and vomiting.  Endocrine: Negative for cold intolerance, heat intolerance and polydipsia.  Genitourinary: Negative for decreased urine volume, dysuria and hematuria.  Musculoskeletal: Negative for arthralgias, back pain and gait problem.  Skin: Negative for rash.  Allergic/Immunologic: Negative for environmental allergies.  Neurological: Positive for seizures. Negative for syncope, light-headedness and headaches.  Hematological: Negative for adenopathy.  Psychiatric/Behavioral: Negative for agitation, dysphoric mood and suicidal ideas. The patient is not nervous/anxious.     Per HPI unless specifically indicated above     Objective:    BP 110/66 (BP Location: Left Arm, Patient Position: Sitting, Cuff Size: Normal)   Pulse 76   Temp 98.1 F (36.7 C)   Ht 5\' 8"  (1.727 m)   Wt 146 lb 8 oz (66.5 kg)   SpO2 98%   BMI 22.28 kg/m   Wt Readings from Last 3 Encounters:  07/22/16 146 lb 8 oz (66.5 kg)  06/24/16 149 lb (67.6 kg)  05/22/16 146 lb 8 oz (66.5 kg)    Physical Exam  Constitutional: He is oriented to person, place, and time. He appears well-developed and well-nourished.  HENT:  Head: Normocephalic and atraumatic.  Neck: Neck supple.  Cardiovascular: Normal rate and regular rhythm.   Pulmonary/Chest: Effort normal and breath sounds normal. He has no wheezes.  Abdominal: Soft. Bowel sounds are normal. There is no hepatosplenomegaly. There is no tenderness.  Musculoskeletal: He exhibits no edema.  Lymphadenopathy:  He has no cervical adenopathy.  Neurological: He is alert and oriented to person, place, and time.  Skin: Skin is warm and dry.  Psychiatric: He has a normal mood and affect. His behavior is normal.  Vitals reviewed.   Results for orders placed or performed in visit on 06/24/16  Dilantin (Phenytoin) level, total  Result Value Ref Range   Phenytoin Lvl 5.1 (L) 10.0 - 20.0 ug/mL      Assessment & Plan:    Encounter Diagnoses  Name Primary?  . Seizure disorder (HCC) Yes  . Personal history of noncompliance with medical treatment, presenting hazards to health      -pt has cone discount application.  He is urged to get it turned in -pt is given one last rx for his dilantin.  He is told that it will be his last from this office -referral to neurology resubmitted -pt to follow up here in one year.  RTO sooner prn

## 2016-10-20 ENCOUNTER — Encounter: Payer: Self-pay | Admitting: Physician Assistant

## 2017-07-28 ENCOUNTER — Ambulatory Visit: Payer: Self-pay | Admitting: Physician Assistant

## 2017-10-23 ENCOUNTER — Ambulatory Visit: Payer: BLUE CROSS/BLUE SHIELD | Admitting: Diagnostic Neuroimaging

## 2017-10-23 ENCOUNTER — Encounter: Payer: Self-pay | Admitting: Diagnostic Neuroimaging

## 2017-10-23 VITALS — BP 111/74 | HR 64 | Ht 68.0 in | Wt 143.2 lb

## 2017-10-23 DIAGNOSIS — G40909 Epilepsy, unspecified, not intractable, without status epilepticus: Secondary | ICD-10-CM | POA: Diagnosis not present

## 2017-10-23 MED ORDER — LEVETIRACETAM 500 MG PO TABS: 500.0000 mg | ORAL_TABLET | Freq: Two times a day (BID) | ORAL | 4 refills | Status: DC

## 2017-10-23 NOTE — Patient Instructions (Signed)
-   continue levetiracetam 500mg twice a day   - According to [place] law, you can not drive unless you are seizure / syncope free for at least 6 months and under physician's care.   - Please maintain precautions. Do not participate in activities where a loss of awareness could harm you or someone else. No swimming alone, no tub bathing, no hot tubs, no driving, no operating motorized vehicles (cars, ATVs, motocycles, etc), lawnmowers, power tools or firearms. No standing at heights, such as rooftops, ladders or stairs. Avoid hot objects such as stoves, heaters, open fires. Wear a helmet when riding a bicycle, scooter, skateboard, etc. and avoid areas of traffic. Set your water heater to 120 degrees or less.   

## 2017-10-23 NOTE — Progress Notes (Signed)
[facility]  PATIENT: William Gomez DOB: 19-Apr-1990  REFERRING CLINICIAN: ER  HISTORY FROM: patient  REASON FOR VISIT: new consult    HISTORICAL  CHIEF COMPLAINT:  Chief Complaint  Patient presents with  . NP [facility] ER  . Dizziness, hx of seziures    Had been on levetiracetam 500mg  po bid, ran out for about 2 mon, started having dizziness, lights, sounds bothering him, like  would have a sz.  Since started back on medicaton, these sx have resolved.  He was diagnosed 2011-12.  Was on dilantin, but switched to levetericetam.      HISTORY OF PRESENT ILLNESS:   27 year old male here for evaluation of seizure disorder.  Patient had for seizure of life in 2011.  He felt dizzy and then passed out had generalized convulsions.  Patient went to the emergency room and was diagnosed with seizure disorder.  He was started on Dilantin initially however due to various factors he would typically run out of medication once a year and had breakthrough seizures.  This continued on for several years.  In March 2019 patient went to the emergency room for seizure and he was switched to levetiracetam.  No seizures since March 2019.  Patient tolerating medication.  He does not currently drive but would like to apply for his driver's license in the future.   REVIEW OF SYSTEMS: Full 14 system review of systems performed and negative with exception of: seizure.  ALLERGIES: No Known Allergies  HOME MEDICATIONS: Outpatient Medications Prior to Visit  Medication Sig Dispense Refill  . levETIRAcetam (KEPPRA) 500 MG tablet Take 500 mg by mouth 2 (two) times daily.  2  . phenytoin (DILANTIN) 100 MG ER capsule Take 1 capsule (100 mg total) by mouth 3 (three) times daily. 90 capsule 0   No facility-administered medications prior to visit.     PAST MEDICAL HISTORY: Past Medical History:  Diagnosis Date  . 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  FAMILY HISTORY: Family History  Problem Relation Age of Onset  . Hypertension Mother   . Hypertension Father   . Cancer Paternal Grandfather     SOCIAL HISTORY: Social History   Socioeconomic History  . Marital status: Single    Spouse name: Not on file  . Number of children: Not on file  . Years of education: Not on file  . Highest education level: Not on file  Occupational History  . Not on file  Social Needs  . Financial resource strain: Not on file  . Food insecurity:    Worry: Not on file    Inability: Not on file  . Transportation needs:    Medical: Not on file    Non-medical: Not on file  Tobacco Use  . Smoking status: Light Tobacco Smoker    Packs/day: 0.11    Years: 1.00    Pack years: 0.11    Types: Cigars    Last attempt to quit: 03/05/2016    Years since quitting: 1.6  . Smokeless tobacco: Never Used  Substance and Sexual Activity  . Alcohol use: Yes    Alcohol/week: 0.0 standard drinks    Comment: occasionally drinks beersocially  . Drug use: Not Currently    Types: Marijuana  . Sexual activity: Not on file  Lifestyle  . Physical activity:    Days per week: Not on file    Minutes per session: Not on file  .  Stress: Not on file  Relationships  . Social connections:    Talks on phone: Not on file    Gets together: Not on file    Attends religious service: Not on file    Active member of club or organization: Not on file    Attends meetings of clubs or organizations: Not on file    Relationship status: Not on file  . Intimate partner violence:    Fear of current or ex partner: Not on file    Emotionally abused: Not on file    Physically abused: Not on file    Forced sexual activity: Not on file  Other Topics Concern  . Not on file  Social History Narrative   Lives at home with parents.  Works at [employer].  Education HS.       PHYSICAL EXAM  GENERAL EXAM/CONSTITUTIONAL: Vitals:  Vitals:   10/23/17 1126  BP:  111/74  Pulse: 64  Weight: 143 lb 3.2 oz (65 kg)  Height: 5\' 8"  (1.727 m)     Body mass index is 21.77 kg/m. Wt Readings from Last 3 Encounters:  10/23/17 143 lb 3.2 oz (65 kg)  07/22/16 146 lb 8 oz (66.5 kg)  06/24/16 149 lb (67.6 kg)     Patient is in no distress; well developed, nourished and groomed; neck is supple  CARDIOVASCULAR:  Examination of carotid arteries is normal; no carotid bruits  Regular rate and rhythm, no murmurs  Examination of peripheral vascular system by observation and palpation is normal  EYES:  Ophthalmoscopic exam of optic discs and posterior segments is normal; no papilledema or hemorrhages  Visual Acuity Screening   Right eye Left eye Both eyes  Without correction: 20/30 20/40   With correction:        MUSCULOSKELETAL:  Gait, strength, tone, movements noted in Neurologic exam below  NEUROLOGIC: MENTAL STATUS:  No flowsheet data found.  awake, alert, oriented to person, place and time  recent and remote memory intact  normal attention and concentration  language fluent, comprehension intact, naming intact  fund of knowledge appropriate  CRANIAL NERVE:   2nd - no papilledema on fundoscopic exam  2nd, 3rd, 4th, 6th - pupils equal and reactive to light, visual fields full to confrontation, extraocular muscles intact, no nystagmus  5th - facial sensation symmetric  7th - facial strength symmetric  8th - hearing intact  9th - palate elevates symmetrically, uvula midline  11th - shoulder shrug symmetric  12th - tongue protrusion midline  MOTOR:   normal bulk and tone, full strength in the BUE, BLE  SENSORY:   normal and symmetric to light touch, temperature, vibration  COORDINATION:   finger-nose-finger, fine finger movements normal  REFLEXES:   deep tendon reflexes present and symmetric  GAIT/STATION:   narrow based gait; able to walk tandem; romberg is negative     DIAGNOSTIC DATA (LABS, IMAGING,  TESTING) - I reviewed patient records, labs, notes, testing and imaging myself where available.  Lab Results  Component Value Date   WBC 7.0 03/03/2016   HGB 14.4 03/03/2016   HCT 44.1 03/03/2016   MCV 82.9 03/03/2016   PLT 203 03/03/2016      Component Value Date/Time   NA 139 03/03/2016 1723   K 4.3 03/03/2016 1723   CL 106 03/03/2016 1723   CO2 25 03/03/2016 1723   GLUCOSE 90 03/03/2016 1723   BUN 6 03/03/2016 1723   CREATININE 1.12 03/03/2016 1723   CALCIUM 9.3 03/03/2016 1723  PROT 7.1 03/03/2016 1723   ALBUMIN 4.2 03/03/2016 1723   AST 30 03/03/2016 1723   ALT 13 (L) 03/03/2016 1723   ALKPHOS 59 03/03/2016 1723   BILITOT 0.7 03/03/2016 1723   GFRNONAA >60 03/03/2016 1723   GFRAA >60 03/03/2016 1723   Lab Results  Component Value Date   CHOL 160 07/03/2015   HDL 50 07/03/2015   LDLCALC 95 07/03/2015   TRIG 77 07/03/2015   CHOLHDL 3.2 07/03/2015   Lab Results  Component Value Date   HGBA1C 5.2 07/03/2015   No results found for: VITAMINB12 Lab Results  Component Value Date   TSH 0.703 Test methodology is 3rd generation TSH 01/14/2009    01/14/09 MRI brain [I reviewed images myself and agree with interpretation. -[facility]]  1.  No acute intracranial abnormality or focal lesion to explain seizures. 2.  Left frontal sinus opacification and expansion, concerning for a mucocele. 3.  Small polyps or mucous retention cysts of the maxillary sinuses bilaterally.   ASSESSMENT AND PLAN  27 y.o. year old male here with seizure disorder.   Dx: seizure disorder (? Primary generalized; last seizure March 2019)  1. Seizure disorder (HCC)     PLAN:  - continue levetiracetam 500mg  twice a day   - According to [place] law, you can not drive unless you are seizure / syncope free for at least 6 months and under physician's care.   - Please maintain precautions. Do not participate in activities where a loss of awareness could harm you or someone else. No swimming alone, no  tub bathing, no hot tubs, no driving, no operating motorized vehicles (cars, ATVs, motocycles, etc), lawnmowers, power tools or firearms. No standing at heights, such as rooftops, ladders or stairs. Avoid hot objects such as stoves, heaters, open fires. Wear a helmet when riding a bicycle, scooter, skateboard, etc. and avoid areas of traffic. Set your water heater to 120 degrees or less.   Meds ordered this encounter  Medications  . levETIRAcetam (KEPPRA) 500 MG tablet    Sig: Take 1 tablet (500 mg total) by mouth 2 (two) times daily.    Dispense:  180 tablet    Refill:  4   Return in about 1 year (around 10/24/2018) for with NP.    Suanne Marker, MD 10/23/2017, 11:55 AM Certified in Neurology, Neurophysiology and Neuroimaging  [facility] [address], [address] [place], [place] [address] [phone]

## 2018-10-27 ENCOUNTER — Ambulatory Visit: Payer: BC Managed Care – PPO | Admitting: Family Medicine

## 2018-10-27 ENCOUNTER — Ambulatory Visit: Payer: BLUE CROSS/BLUE SHIELD | Admitting: Adult Health

## 2018-10-27 ENCOUNTER — Encounter: Payer: Self-pay | Admitting: Family Medicine

## 2018-10-27 ENCOUNTER — Other Ambulatory Visit: Payer: Self-pay

## 2018-10-27 VITALS — BP 108/66 | HR 65 | Temp 97.3°F | Ht 68.0 in | Wt 146.2 lb

## 2018-10-27 DIAGNOSIS — G40909 Epilepsy, unspecified, not intractable, without status epilepticus: Secondary | ICD-10-CM | POA: Diagnosis not present

## 2018-10-27 MED ORDER — LEVETIRACETAM 500 MG PO TABS: 500.0000 mg | ORAL_TABLET | Freq: Two times a day (BID) | ORAL | 4 refills | Status: DC

## 2018-10-27 NOTE — Patient Instructions (Signed)
Continue levetiracetam 500mg  twice daily  Follow up in 1 year   Seizure, Adult A seizure is a sudden burst of abnormal electrical activity in the brain. Seizures usually last from 30 seconds to 2 minutes. They can cause many different symptoms. Usually, seizures are not harmful unless they last a long time. What are the causes? Common causes of this condition include:  Fever or infection.  Conditions that affect the brain, such as: ? A brain abnormality that you were born with. ? A brain or head injury. ? Bleeding in the brain. ? A tumor. ? Stroke. ? Brain disorders such as autism or cerebral palsy.  Low blood sugar.  Conditions that are passed from parent to child (are inherited).  Problems with substances, such as: ? Having a reaction to a drug or a medicine. ? Suddenly stopping the use of a substance (withdrawal). In some cases, the cause may not be known. A person who has repeated seizures over time without a clear cause has a condition called epilepsy. What increases the risk? You are more likely to get this condition if you have:  A family history of epilepsy.  Had a seizure in the past.  A brain disorder.  A history of head injury, lack of oxygen at birth, or strokes. What are the signs or symptoms? There are many types of seizures. The symptoms vary depending on the type of seizure you have. Examples of symptoms during a seizure include:  Shaking (convulsions).  Stiffness in the body.  Passing out (losing consciousness).  Head nodding.  Staring.  Not responding to sound or touch.  Loss of bladder control and bowel control. Some people have symptoms right before and right after a seizure happens. Symptoms before a seizure may include:  Fear.  Worry (anxiety).  Feeling like you may vomit (nauseous).  Feeling like the room is spinning (vertigo).  Feeling like you saw or heard something before (dj vu).  Odd tastes or smells.  Changes in how  you see. You may see flashing lights or spots. Symptoms after a seizure happens can include:  Confusion.  Sleepiness.  Headache.  Weakness on one side of the body. How is this treated? Most seizures will stop on their own in under 5 minutes. In these cases, no treatment is needed. Seizures that last longer than 5 minutes will usually need treatment. Treatment can include:  Medicines given through an IV tube.  Avoiding things that are known to cause your seizures. These can include medicines that you take for another condition.  Medicines to treat epilepsy.  Surgery to stop the seizures. This may be needed if medicines do not help. Follow these instructions at home: Medicines  Take over-the-counter and prescription medicines only as told by your doctor.  Do not eat or drink anything that may keep your medicine from working, such as alcohol. Activity  Do not do any activities that would be dangerous if you had another seizure, like driving or swimming. Wait until your doctor says it is safe for you to do them.  If you live in the [place] ask your local DMV (department of motor vehicles) when you can drive.  Get plenty of rest. Teaching others Teach friends and family what to do when you have a seizure. They should:  Lay you on the ground.  Protect your head and body.  Loosen any tight clothing around your neck.  Turn you on your side.  Not hold you down.  Not put anything into  your mouth.  Know whether or not you need emergency care.  Stay with you until you are better.  General instructions  Contact your doctor each time you have a seizure.  Avoid anything that gives you seizures.  Keep a seizure diary. Write down: ? What you think caused each seizure. ? What you remember about each seizure.  Keep all follow-up visits as told by your doctor. This is important. Contact a doctor if:  You have another seizure.  You have seizures more often.  There is any  change in what happens during your seizures.  You keep having seizures with treatment.  You have symptoms of being sick or having an infection. Get help right away if:  You have a seizure that: ? Lasts longer than 5 minutes. ? Is different than seizures you had before. ? Makes it harder to breathe. ? Happens after you hurt your head.  You have any of these symptoms after a seizure: ? Not being able to speak. ? Not being able to use a part of your body. ? Confusion. ? A bad headache.  You have two or more seizures in a row.  You do not wake up right after a seizure.  You get hurt during a seizure. These symptoms may be an emergency. Do not wait to see if the symptoms will go away. Get medical help right away. Call your local emergency services (911 in the [place] Do not drive yourself to the hospital. Summary  Seizures usually last from 30 seconds to 2 minutes. Usually, they are not harmful unless they last a long time.  Do not eat or drink anything that may keep your medicine from working, such as alcohol.  Teach friends and family what to do when you have a seizure.  Contact your doctor each time you have a seizure. This information is not intended to replace advice given to you by your health care provider. Make sure you discuss any questions you have with your health care provider. Document Released: 08/20/2007 Document Revised: 05/21/2018 Document Reviewed: 05/21/2018 Elsevier Patient Education  [employer].

## 2018-10-27 NOTE — Progress Notes (Signed)
PATIENT: William Gomez DOB: 02-01-1991  REASON FOR VISIT: follow up HISTORY FROM: patient  Chief Complaint  Patient presents with  . Follow-up    Yearly f/u. Alone. New room. No new concerns at this time.      HISTORY OF PRESENT ILLNESS: Today 10/27/18 William Gomez is a 28 y.o. male here today for follow up for seizure. He continues levetiracetam 500mg  twice daily.  He is tolerating the medication well with no obvious adverse effects.  He does admit to missing doses on occasion.  There was a period of time during the beginning of the pandemic where he was out of his medication for several weeks.  He states that he has been back on medication consistently for the past 2 to 3 months.  He denies any seizure activity since March 2019.  He does not drive.  HISTORY: (copied from Dr Richrd HumblesPenumalli's note on 10/23/2017)  28 year old male here for evaluation of seizure disorder.  Patient had for seizure of life in 2011.  He felt dizzy and then passed out had generalized convulsions.  Patient went to the emergency room and was diagnosed with seizure disorder.  He was started on Dilantin initially however due to various factors he would typically run out of medication once a year and had breakthrough seizures.  This continued on for several years.  In March 2019 patient went to the emergency room for seizure and he was switched to levetiracetam.  No seizures since March 2019.  Patient tolerating medication.  He does not currently drive but would like to apply for his driver's license in the future.   REVIEW OF SYSTEMS: Out of a complete 14 system review of symptoms, the patient complains only of the following symptoms, none and all other reviewed systems are negative.  ALLERGIES: No Known Allergies  HOME MEDICATIONS: Outpatient Medications Prior to Visit  Medication Sig Dispense Refill  . levETIRAcetam (KEPPRA) 500 MG tablet Take 1 tablet (500 mg total) by mouth 2 (two) times daily. 180  tablet 4   No facility-administered medications prior to visit.     PAST MEDICAL HISTORY: Past Medical History:  Diagnosis Date  . 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 No pertinent surgical history.  FAMILY HISTORY: Family History  Problem Relation Age of Onset  . Hypertension Mother   . Hypertension Father   . Cancer Paternal Grandfather     SOCIAL HISTORY: Social History   Socioeconomic History  . Marital status: Single    Spouse name: Not on file  . Number of children: Not on file  . Years of education: Not on file  . Highest education level: Not on file  Occupational History  . Not on file  Social Needs  . Financial resource strain: Not on file  . Food insecurity    Worry: Not on file    Inability: Not on file  . Transportation needs    Medical: Not on file    Non-medical: Not on file  Tobacco Use  . Smoking status: Light Tobacco Smoker    Packs/day: 0.11    Years: 1.00    Pack years: 0.11    Types: Cigars    Last attempt to quit: 03/05/2016    Years since quitting: 2.6  . Smokeless tobacco: Never Used  Substance and Sexual Activity  . Alcohol use: Yes    Alcohol/week: 0.0 standard drinks    Comment: occasionally drinks beersocially  . Drug use:  Not Currently    Types: Marijuana  . Sexual activity: Not on file  Lifestyle  . Physical activity    Days per week: Not on file    Minutes per session: Not on file  . Stress: Not on file  Relationships  . Social Musicianconnections    Talks on phone: Not on file    Gets together: Not on file    Attends religious service: Not on file    Active member of club or organization: Not on file    Attends meetings of clubs or organizations: Not on file    Relationship status: Not on file  . Intimate partner violence    Fear of current or ex partner: Not on file    Emotionally abused: Not on file    Physically abused: Not on file    Forced sexual activity: Not on file  Other  Topics Concern  . Not on file  Social History Narrative   Lives at home with parents.  Works at [employer].  Education HS.        PHYSICAL EXAM  Vitals:   10/27/18 1038  BP: 108/66  Pulse: 65  Temp: (!) 97.3 F (36.3 C)  TempSrc: Oral  Weight: 146 lb 3.2 oz (66.3 kg)  Height: 5\' 8"  (1.727 m)   Body mass index is 22.23 kg/m.  Generalized: Well developed, in no acute distress  Cardiology: normal rate and rhythm, no murmur noted Neurological examination  Mentation: Alert oriented to time, place, history taking. Follows all commands speech and language fluent Cranial nerve II-XII: Pupils were equal round reactive to light. Extraocular movements were full, visual field were full on confrontational test. Facial sensation and strength were normal. Uvula tongue midline. Head turning and shoulder shrug  were normal and symmetric. Motor: The motor testing reveals 5 over 5 strength of all 4 extremities. Good symmetric motor tone is noted throughout.  Sensory: Sensory testing is intact to soft touch on all 4 extremities. No evidence of extinction is noted.  Coordination: Cerebellar testing reveals good finger-nose-finger and heel-to-shin bilaterally.  Gait and station: Gait is normal. .   DIAGNOSTIC DATA (LABS, IMAGING, TESTING) - I reviewed patient records, labs, notes, testing and imaging myself where available.  No flowsheet data found.   Lab Results  Component Value Date   WBC 7.0 03/03/2016   HGB 14.4 03/03/2016   HCT 44.1 03/03/2016   MCV 82.9 03/03/2016   PLT 203 03/03/2016      Component Value Date/Time   NA 139 03/03/2016 1723   K 4.3 03/03/2016 1723   CL 106 03/03/2016 1723   CO2 25 03/03/2016 1723   GLUCOSE 90 03/03/2016 1723   BUN 6 03/03/2016 1723   CREATININE 1.12 03/03/2016 1723   CALCIUM 9.3 03/03/2016 1723   PROT 7.1 03/03/2016 1723   ALBUMIN 4.2 03/03/2016 1723   AST 30 03/03/2016 1723   ALT 13 (L) 03/03/2016 1723   ALKPHOS 59 03/03/2016 1723   BILITOT  0.7 03/03/2016 1723   GFRNONAA >60 03/03/2016 1723   GFRAA >60 03/03/2016 1723   Lab Results  Component Value Date   CHOL 160 07/03/2015   HDL 50 07/03/2015   LDLCALC 95 07/03/2015   TRIG 77 07/03/2015   CHOLHDL 3.2 07/03/2015   Lab Results  Component Value Date   HGBA1C 5.2 07/03/2015   No results found for: [id number]    ASSESSMENT AND PLAN 28 y.o. year old male  has a past medical history of Seizure, grand  mal ([place]) (2010). here with     ICD-10-CM   1. Seizure disorder ([place])  Hepler is doing well on levetiracetam 500 mg twice daily.  We will continue current therapy.  I have discussed the importance of taking medication daily without missed doses.  He is aware of potential breakthrough seizure.  Seizure precautions given.  He will follow-up annually.  He verbalizes understanding and agreement with this plan.  No orders of the defined types were placed in this encounter.    Meds ordered this encounter  Medications  . levETIRAcetam (KEPPRA) 500 MG tablet    Sig: Take 1 tablet (500 mg total) by mouth 2 (two) times daily.    Dispense:  180 tablet    Refill:  4    Order Specific Question:   Supervising Provider    Answer:   Melvenia Beam [id number]      I spent 15 minutes with the patient. 50% of this time was spent counseling and educating patient on plan of care and medications.    Debbora Presto, FNP-C 10/27/2018, 11:38 AM [facility] [address], [place], [address] [address] [phone]

## 2018-11-03 NOTE — Progress Notes (Signed)
I reviewed note and agree with plan.   VIKRAM R. PENUMALLI, MD 11/03/2018, 2:26 PM Certified in Neurology, Neurophysiology and Neuroimaging  [facility] [address], [address] [place], [place] [address] [phone]  

## 2019-01-29 ENCOUNTER — Other Ambulatory Visit: Payer: Self-pay

## 2019-01-29 ENCOUNTER — Emergency Department (HOSPITAL_COMMUNITY)
Admission: EM | Admit: 2019-01-29 | Discharge: 2019-01-29 | Disposition: A | Discharge status: Home / Self Care | Payer: BC Managed Care – PPO | Attending: Emergency Medicine | Admitting: Emergency Medicine

## 2019-01-29 DIAGNOSIS — Y939 Activity, unspecified: Secondary | ICD-10-CM | POA: Diagnosis not present

## 2019-01-29 DIAGNOSIS — Y929 Unspecified place or not applicable: Secondary | ICD-10-CM | POA: Insufficient documentation

## 2019-01-29 DIAGNOSIS — Y999 Unspecified external cause status: Secondary | ICD-10-CM | POA: Insufficient documentation

## 2019-01-29 DIAGNOSIS — S0081XA Abrasion of other part of head, initial encounter: Secondary | ICD-10-CM | POA: Insufficient documentation

## 2019-01-29 DIAGNOSIS — W19XXXA Unspecified fall, initial encounter: Secondary | ICD-10-CM | POA: Diagnosis not present

## 2019-01-29 DIAGNOSIS — S0990XA Unspecified injury of head, initial encounter: Secondary | ICD-10-CM | POA: Diagnosis present

## 2019-01-29 DIAGNOSIS — Z23 Encounter for immunization: Secondary | ICD-10-CM | POA: Insufficient documentation

## 2019-01-29 DIAGNOSIS — R55 Syncope and collapse: Secondary | ICD-10-CM | POA: Insufficient documentation

## 2019-01-29 DIAGNOSIS — F17210 Nicotine dependence, cigarettes, uncomplicated: Secondary | ICD-10-CM | POA: Insufficient documentation

## 2019-01-29 LAB — BASIC METABOLIC PANEL
Anion gap: 3 — ABNORMAL LOW (ref 5–15)
BUN: 11 mg/dL (ref 6–20)
CO2: 23 mmol/L (ref 22–32)
Calcium: 8.7 mg/dL — ABNORMAL LOW (ref 8.9–10.3)
Chloride: 112 mmol/L — ABNORMAL HIGH (ref 98–111)
Creatinine, Ser: 0.93 mg/dL (ref 0.61–1.24)
GFR calc Af Amer: >60 mL/min (ref >60)
GFR calc non Af Amer: >60 mL/min (ref >60)
Glucose, Bld: 98 mg/dL (ref 70–99)
Potassium: 4.2 mmol/L (ref 3.5–5.1)
Sodium: 138 mmol/L (ref 135–145)

## 2019-01-29 LAB — CBC WITH DIFFERENTIAL/PLATELET
Abs Immature Granulocytes: 0.02 10*3/uL (ref 0.00–0.07)
Basophils Absolute: 0.1 10*3/uL (ref 0.0–0.1)
Basophils Relative: 1 %
Eosinophils Absolute: 0.2 10*3/uL (ref 0.0–0.5)
Eosinophils Relative: 2 %
HCT: 46.9 % (ref 39.0–52.0)
Hemoglobin: 14.6 g/dL (ref 13.0–17.0)
Immature Granulocytes: 0 %
Lymphocytes Relative: 19 %
Lymphs Abs: 1.7 10*3/uL (ref 0.7–4.0)
MCH: 27.8 pg (ref 26.0–34.0)
MCHC: 31.1 g/dL (ref 30.0–36.0)
MCV: 89.2 fL (ref 80.0–100.0)
Monocytes Absolute: 0.8 10*3/uL (ref 0.1–1.0)
Monocytes Relative: 8 %
Neutro Abs: 6.3 10*3/uL (ref 1.7–7.7)
Neutrophils Relative %: 70 %
Platelets: 179 10*3/uL (ref 150–400)
RBC: 5.26 MIL/uL (ref 4.22–5.81)
RDW: 13 % (ref 11.5–15.5)
WBC: 9.1 10*3/uL (ref 4.0–10.5)
nRBC: 0 % (ref 0.0–0.2)

## 2019-01-29 MED ORDER — LEVETIRACETAM IN NACL 1000 MG/100ML IV SOLN
1000.0000 mg | Freq: Once | INTRAVENOUS | Status: AC
  Administered 2019-01-29: 03:00:00 1000 mg via INTRAVENOUS
  Filled 2019-01-29: qty 100

## 2019-01-29 MED ORDER — TETANUS-DIPHTH-ACELL PERTUSSIS 5-2.5-18.5 LF-MCG/0.5 IM SUSP
0.5000 mL | Freq: Once | INTRAMUSCULAR | Status: AC
  Administered 2019-01-29: 03:00:00 0.5 mL via INTRAMUSCULAR
  Filled 2019-01-29: qty 0.5

## 2019-01-29 MED ORDER — LACTATED RINGERS IV BOLUS
1000.0000 mL | Freq: Once | INTRAVENOUS | Status: AC
  Administered 2019-01-29: 03:00:00 1000 mL via INTRAVENOUS

## 2019-01-29 MED ORDER — LEVETIRACETAM 500 MG PO TABS: 500.0000 mg | ORAL_TABLET | Freq: Two times a day (BID) | ORAL | 0 refills | Status: DC

## 2019-01-29 NOTE — ED Provider Notes (Signed)
[facility] EMERGENCY DEPARTMENT Provider Note   CSN: [id number] Arrival date & time: 01/29/19  0004     History   Chief Complaint Chief Complaint  Patient presents with  . Head Injury    Possible seizure    HPI William Gomez is a 28 y.o. male.     History of epilepsy and apparently has not been on medication for at least a week.   Loss of Consciousness Episode history:  Single Most recent episode:  Today Duration: unknown. Timing:  Intermittent Progression:  Resolved Chronicity:  Recurrent Context comment:  Playing video games Witnessed: no   Relieved by:  Nothing Worsened by:  Nothing Ineffective treatments:  None tried Associated symptoms: no anxiety     Past Medical History:  Diagnosis Date  . 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 Cigarette nicotine dependence without complication 07/03/2015  . Seizure disorder (HCC) 01/29/2009  . INSOMNIA-SLEEP DISORDER-UNSPEC 01/29/2009    No past surgical history on file.      Home Medications    Prior to Admission medications   Medication Sig Start Date End Date Taking? Authorizing Provider  levETIRAcetam (KEPPRA) 500 MG tablet Take 1 tablet (500 mg total) by mouth 2 (two) times daily. 01/29/19   Errik Mitchelle, Barbara Cower, MD    Family History Family History  Problem Relation Age of Onset  . Hypertension Mother   . Hypertension Father   . Cancer Paternal Grandfather     Social History Social History   Tobacco Use  . Smoking status: Light Tobacco Smoker    Packs/day: 0.11    Years: 1.00    Pack years: 0.11    Types: Cigars    Last attempt to quit: 03/05/2016    Years since quitting: 2.9  . Smokeless tobacco: Never Used  Substance Use Topics  . Alcohol use: Yes    Alcohol/week: 0.0 standard drinks    Comment: occasionally drinks beersocially  . Drug use: Not Currently    Types: Marijuana     Allergies    Patient has no known allergies.   Review of Systems Review of Systems  Cardiovascular: Positive for syncope.  All other systems reviewed and are negative.    Physical Exam Updated Vital Signs BP 118/78   Pulse 72   Temp 98.2 F (36.8 C) (Oral)   Resp 16   Ht 5\' 7"  (1.702 m)   Wt 65.8 kg   SpO2 100%   BMI 22.71 kg/m   Physical Exam Vitals signs and nursing note reviewed.  Constitutional:      Appearance: He is well-developed.  HENT:     Head: Normocephalic.     Comments: Abrasion on left forehead    Mouth/Throat:     Pharynx: Oropharynx is clear.  Eyes:     Extraocular Movements: Extraocular movements intact.     Conjunctiva/sclera: Conjunctivae normal.  Neck:     Musculoskeletal: Normal range of motion.  Cardiovascular:     Rate and Rhythm: Normal rate.  Pulmonary:     Effort: Pulmonary effort is normal. No respiratory distress.  Abdominal:     General: There is no distension.     Tenderness: There is no abdominal tenderness.  Musculoskeletal: Normal range of motion.        General: No swelling or tenderness.  Skin:    General: Skin is warm and dry.     Coloration:  Skin is not jaundiced.  Neurological:     General: No focal deficit present.     Mental Status: He is alert.     Comments: No altered mental status, able to give full seemingly accurate history.  Face is symmetric, EOM's intact, pupils equal and reactive, vision intact, tongue and uvula midline without deviation. Upper and Lower extremity motor 5/5, intact pain perception in distal extremities, 2+ reflexes in biceps, patella and achilles tendons. Able to perform finger to nose normal with both hands. Walks without assistance or evident ataxia.       ED Treatments / Results  Labs (all labs ordered are listed, but only abnormal results are displayed) Labs Reviewed  BASIC METABOLIC PANEL - Abnormal; Notable for the following components:      Result Value   Chloride 112 (*)    Calcium 8.7 (*)     Anion gap 3 (*)    All other components within normal limits  CBC WITH DIFFERENTIAL/PLATELET    EKG None  Radiology No results found.  Procedures Procedures (including critical care time)  Medications Ordered in ED Medications  levETIRAcetam (KEPPRA) IVPB 1000 mg/100 mL premix (0 mg Intravenous Stopped 01/29/19 0253)  Tdap (BOOSTRIX) injection 0.5 mL (0.5 mLs Intramuscular Given 01/29/19 0233)  lactated ringers bolus 1,000 mL (0 mLs Intravenous Stopped 01/29/19 0354)     Initial Impression / Assessment and Plan / ED Course  I have reviewed the triage vital signs and the nursing notes.  Pertinent labs & imaging results that were available during my care of the patient were reviewed by me and considered in my medical decision making (see chart for details).  Possibly seizure [place] to noncompliance versus syncopal episode.  Patient's work-up here is negative.  No high risk features to indicate need for CT scan of his head.  Tetanus is up-to-date.  keppra loaded. Back to neurologic baseline.  Stable for discharge. rx given. Discouraged driving until neuro follow up.  Final Clinical Impressions(s) / ED Diagnoses   Final diagnoses:  Fall, initial encounter  Brief loss of consciousness    ED Discharge Orders         Ordered    levETIRAcetam (KEPPRA) 500 MG tablet  2 times daily     01/29/19 0320           Alexias Margerum, Corene Cornea, MD 01/29/19 [phone]

## 2019-01-29 NOTE — ED Triage Notes (Addendum)
Pt states he was at home playing a video game and the next thing he knew he woke up on the floor.  Pt has seizure history, taking keppra for same.  Pt states he thinks he hit his head on the edge of the dresser.

## 2019-11-01 ENCOUNTER — Encounter: Payer: Self-pay | Admitting: Family Medicine

## 2019-11-01 ENCOUNTER — Ambulatory Visit: Payer: BC Managed Care – PPO | Admitting: Family Medicine

## 2019-11-01 VITALS — BP 114/80 | HR 70 | Ht 67.0 in | Wt 144.8 lb

## 2019-11-01 DIAGNOSIS — G40909 Epilepsy, unspecified, not intractable, without status epilepticus: Secondary | ICD-10-CM | POA: Diagnosis not present

## 2019-11-01 MED ORDER — LEVETIRACETAM 500 MG PO TABS: 500.0000 mg | ORAL_TABLET | Freq: Two times a day (BID) | ORAL | 3 refills | Status: DC

## 2019-11-01 NOTE — Progress Notes (Signed)
PATIENT: William Gomez DOB: 1990-08-07  REASON FOR VISIT: follow up HISTORY FROM: patient  Chief Complaint  Patient presents with  . Follow-up    45yr f/u. States he has been doing well since last visit. Had "seizure like" symptoms once last year but can't remember the date.   . room 1    alone     HISTORY OF PRESENT ILLNESS: Today 11/01/19 William Gomez is a 29 y.o. male here today for follow up for seizures. He continues levetiracetam. He admits that he forgets medication at least once every week. He had an event in 01/2019 where he was seen in the ER for possible seizure activity. Event occurred in setting of being off medication for about a week. No events since. He does not drive. He has a roommate.   HISTORY: (copied from my note on 10/27/2018)  William Gomez is a 29 y.o. male here today for follow up for seizure. He continues levetiracetam 500mg  twice daily.  He is tolerating the medication well with no obvious adverse effects.  He does admit to missing doses on occasion.  There was a period of time during the beginning of the pandemic where he was out of his medication for several weeks.  He states that he has been back on medication consistently for the past 2 to 3 months.  He denies any seizure activity since March 2019.  He does not drive.  HISTORY: (copied from Dr April 2019 note on 10/23/2017)  29 year old male here for evaluation of seizure disorder.  Patient had for seizure of life in 2011. He felt dizzy and then passed out had generalized convulsions. Patient went to the emergency room and was diagnosed with seizure disorder. He was started on Dilantin initially however due to various factors he would typically run out of medication once a year and had breakthrough seizures. This continued on for several years. In March 2019 patient went to the emergency room for seizure and he was switched to levetiracetam.  No seizures since March 2019. Patient  tolerating medication. He does not currently drive but would like to apply for his driver's license in the future.   REVIEW OF SYSTEMS: Out of a complete 14 system review of symptoms, the patient complains only of the following symptoms, seizures and all other reviewed systems are negative.  ALLERGIES: No Known Allergies  HOME MEDICATIONS: Outpatient Medications Prior to Visit  Medication Sig Dispense Refill  . levETIRAcetam (KEPPRA) 500 MG tablet Take 1 tablet (500 mg total) by mouth 2 (two) times daily. 60 tablet 0   No facility-administered medications prior to visit.    PAST MEDICAL HISTORY: Past Medical History:  Diagnosis Date  . 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  FAMILY HISTORY: Family History  Problem Relation Age of Onset  . Hypertension Mother   . Hypertension Father   . Cancer Paternal Grandfather     SOCIAL HISTORY: Social History   Socioeconomic History  . Marital status: Single    Spouse name: Not on file  . Number of children: Not on file  . Years of education: Not on file  . Highest education level: Not on file  Occupational History  . Not on file  Tobacco Use  . Smoking status: Light Tobacco Smoker    Packs/day: 0.11    Years: 1.00    Pack years: 0.11    Types: Cigars    Last attempt  to quit: 03/05/2016    Years since quitting: 3.6  . Smokeless tobacco: Never Used  Substance and Sexual Activity  . Alcohol use: Yes    Alcohol/week: 0.0 standard drinks    Comment: occasionally drinks beersocially  . Drug use: Not Currently    Types: Marijuana  . Sexual activity: Not on file  Other Topics Concern  . Not on file  Social History Narrative   Lives at home with parents.  Works at [employer].  Education HS.     Social Determinants of Health   Financial Resource Strain:   . Difficulty of Paying Living Expenses:   Food Insecurity:   . Worried About Programme researcher, broadcasting/film/video in the Last Year:     . Barista in the Last Year:   Transportation Needs:   . Freight forwarder (Medical):   Marland Kitchen Lack of Transportation (Non-Medical):   Physical Activity:   . Days of Exercise per Week:   . Minutes of Exercise per Session:   Stress:   . Feeling of Stress :   Social Connections:   . Frequency of Communication with Friends and Family:   . Frequency of Social Gatherings with Friends and Family:   . Attends Religious Services:   . Active Member of Clubs or Organizations:   . Attends Banker Meetings:   Marland Kitchen Marital Status:   Intimate Partner Violence:   . Fear of Current or Ex-Partner:   . Emotionally Abused:   Marland Kitchen Physically Abused:   . Sexually Abused:       PHYSICAL EXAM  Vitals:   11/01/19 1243  BP: 114/80  Pulse: 70  Weight: 144 lb 12.8 oz (65.7 kg)  Height: 5\' 7"  (1.702 m)   Body mass index is 22.68 kg/m.  Generalized: Well developed, in no acute distress  Cardiology: normal rate and rhythm, no murmur noted Respiratory: clear to auscultation bilaterally  Neurological examination  Mentation: Alert oriented to time, place, history taking. Follows all commands speech and language fluent Cranial nerve II-XII: Pupils were equal round reactive to light. Extraocular movements were full, visual field were full on confrontational test. Facial sensation and strength were normal. Uvula tongue midline. Head turning and shoulder shrug  were normal and symmetric. Motor: The motor testing reveals 5 over 5 strength of all 4 extremities. Good symmetric motor tone is noted throughout.  Sensory: Sensory testing is intact to soft touch on all 4 extremities. No evidence of extinction is noted.  Coordination: Cerebellar testing reveals good finger-nose-finger and heel-to-shin bilaterally.  Gait and station: Gait is normal.   DIAGNOSTIC DATA (LABS, IMAGING, TESTING) - I reviewed patient records, labs, notes, testing and imaging myself where available.  No flowsheet data  found.   Lab Results  Component Value Date   WBC 9.1 01/29/2019   HGB 14.6 01/29/2019   HCT 46.9 01/29/2019   MCV 89.2 01/29/2019   PLT 179 01/29/2019      Component Value Date/Time   NA 138 01/29/2019 0239   K 4.2 01/29/2019 0239   CL 112 (H) 01/29/2019 0239   CO2 23 01/29/2019 0239   GLUCOSE 98 01/29/2019 0239   BUN 11 01/29/2019 0239   CREATININE 0.93 01/29/2019 0239   CALCIUM 8.7 (L) 01/29/2019 0239   PROT 7.1 03/03/2016 1723   ALBUMIN 4.2 03/03/2016 1723   AST 30 03/03/2016 1723   ALT 13 (L) 03/03/2016 1723   ALKPHOS 59 03/03/2016 1723   BILITOT 0.7 03/03/2016 1723  GFRNONAA >60 01/29/2019 0239   GFRAA >60 01/29/2019 0239   Lab Results  Component Value Date   CHOL 160 07/03/2015   HDL 50 07/03/2015   LDLCALC 95 07/03/2015   TRIG 77 07/03/2015   CHOLHDL 3.2 07/03/2015   Lab Results  Component Value Date   HGBA1C 5.2 07/03/2015   No results found for: VITAMINB12     ASSESSMENT AND PLAN 29 y.o. year old male  has a past medical history of Seizure, grand mal (HCC) (2010). here with     ICD-10-CM   1. Seizure disorder (HCC)  G40.909     Eber continues to tolerate levetiracetam 500mg  twice daily. We have had a lengthy discussion regarding the importance of consistency and daily compliance with seizure prevention medication. I have offered to start extended release levetiracetam but he wishes to continue current dosing. He will set a reminder on his phone to help with compliance. Healthy lifestyle habits encouraged. He will follow up with me in 1 year, sooner if needed.    No orders of the defined types were placed in this encounter.    Meds ordered this encounter  Medications  . DISCONTD: levETIRAcetam (KEPPRA) 500 MG tablet    Sig: Take 1 tablet (500 mg total) by mouth 2 (two) times daily.    Dispense:  180 tablet    Refill:  3    Order Specific Question:   Supervising Provider    Answer:   Anson Fret  . levETIRAcetam (KEPPRA) 500  MG tablet    Sig: Take 1 tablet (500 mg total) by mouth 2 (two) times daily.    Dispense:  180 tablet    Refill:  3    Order Specific Question:   Supervising Provider    Answer:   [id number] Anson Fret      I spent 15 minutes with the patient. 50% of this time was spent counseling and educating patient on plan of care and medications.    [id number], FNP-C 11/01/2019, 1:59 PM [facility] [address] [place], [place] [phone]

## 2019-11-01 NOTE — Patient Instructions (Signed)
We will continue levetiracetam 500mg  twice daily  Healthy lifestyle habits encouraged.    According to [place] law, you can not drive unless you are seizure / syncope free for at least 6 months and under physician's care.  Please maintain precautions. Do not participate in activities where a loss of awareness could harm you or someone else. No swimming alone, no tub bathing, no hot tubs, no driving, no operating motorized vehicles (cars, ATVs, motocycles, etc), lawnmowers, power tools or firearms. No standing at heights, such as rooftops, ladders or stairs. Avoid hot objects such as stoves, heaters, open fires. Wear a helmet when riding a bicycle, scooter, skateboard, etc. and avoid areas of traffic. Set your water heater to 120 degrees or less.    Follow up in 1 year, sooner if needed    Seizure, Adult A seizure is a sudden burst of abnormal electrical activity in the brain. Seizures usually last from 30 seconds to 2 minutes. They can cause many different symptoms. Usually, seizures are not harmful unless they last a long time. What are the causes? Common causes of this condition include:  Fever or infection.  Conditions that affect the brain, such as: ? A brain abnormality that you were born with. ? A brain or head injury. ? Bleeding in the brain. ? A tumor. ? Stroke. ? Brain disorders such as autism or cerebral palsy.  Low blood sugar.  Conditions that are passed from parent to child (are inherited).  Problems with substances, such as: ? Having a reaction to a drug or a medicine. ? Suddenly stopping the use of a substance (withdrawal). In some cases, the cause may not be known. A person who has repeated seizures over time without a clear cause has a condition called epilepsy. What increases the risk? You are more likely to get this condition if you have:  A family history of epilepsy.  Had a seizure in the past.  A brain disorder.  A history of head injury, lack of  oxygen at birth, or strokes. What are the signs or symptoms? There are many types of seizures. The symptoms vary depending on the type of seizure you have. Examples of symptoms during a seizure include:  Shaking (convulsions).  Stiffness in the body.  Passing out (losing consciousness).  Head nodding.  Staring.  Not responding to sound or touch.  Loss of bladder control and bowel control. Some people have symptoms right before and right after a seizure happens. Symptoms before a seizure may include:  Fear.  Worry (anxiety).  Feeling like you may vomit (nauseous).  Feeling like the room is spinning (vertigo).  Feeling like you saw or heard something before (dj vu).  Odd tastes or smells.  Changes in how you see. You may see flashing lights or spots. Symptoms after a seizure happens can include:  Confusion.  Sleepiness.  Headache.  Weakness on one side of the body. How is this treated? Most seizures will stop on their own in under 5 minutes. In these cases, no treatment is needed. Seizures that last longer than 5 minutes will usually need treatment. Treatment can include:  Medicines given through an IV tube.  Avoiding things that are known to cause your seizures. These can include medicines that you take for another condition.  Medicines to treat epilepsy.  Surgery to stop the seizures. This may be needed if medicines do not help. Follow these instructions at home: Medicines  Take over-the-counter and prescription medicines only as told by your  doctor.  Do not eat or drink anything that may keep your medicine from working, such as alcohol. Activity  Do not do any activities that would be dangerous if you had another seizure, like driving or swimming. Wait until your doctor says it is safe for you to do them.  If you live in the [place] ask your local DMV (department of motor vehicles) when you can drive.  Get plenty of rest. Teaching others Teach  friends and family what to do when you have a seizure. They should:  Lay you on the ground.  Protect your head and body.  Loosen any tight clothing around your neck.  Turn you on your side.  Not hold you down.  Not put anything into your mouth.  Know whether or not you need emergency care.  Stay with you until you are better.  General instructions  Contact your doctor each time you have a seizure.  Avoid anything that gives you seizures.  Keep a seizure diary. Write down: ? What you think caused each seizure. ? What you remember about each seizure.  Keep all follow-up visits as told by your doctor. This is important. Contact a doctor if:  You have another seizure.  You have seizures more often.  There is any change in what happens during your seizures.  You keep having seizures with treatment.  You have symptoms of being sick or having an infection. Get help right away if:  You have a seizure that: ? Lasts longer than 5 minutes. ? Is different than seizures you had before. ? Makes it harder to breathe. ? Happens after you hurt your head.  You have any of these symptoms after a seizure: ? Not being able to speak. ? Not being able to use a part of your body. ? Confusion. ? A bad headache.  You have two or more seizures in a row.  You do not wake up right after a seizure.  You get hurt during a seizure. These symptoms may be an emergency. Do not wait to see if the symptoms will go away. Get medical help right away. Call your local emergency services (911 in the [place] Do not drive yourself to the hospital. Summary  Seizures usually last from 30 seconds to 2 minutes. Usually, they are not harmful unless they last a long time.  Do not eat or drink anything that may keep your medicine from working, such as alcohol.  Teach friends and family what to do when you have a seizure.  Contact your doctor each time you have a seizure. This information is not  intended to replace advice given to you by your health care provider. Make sure you discuss any questions you have with your health care provider. Document Revised: 05/21/2018 Document Reviewed: 05/21/2018 Elsevier Patient Education  2020 [employer].

## 2019-11-10 NOTE — Progress Notes (Signed)
I reviewed note and agree with plan.   Herchel Hopkin R. Ramone Gander, MD 11/10/2019, 6:04 PM Certified in Neurology, Neurophysiology and Neuroimaging  [facility] [address], [address] , [place] [address] [phone]  

## 2020-11-01 ENCOUNTER — Ambulatory Visit: Payer: BC Managed Care – PPO | Admitting: Family Medicine

## 2020-11-21 ENCOUNTER — Other Ambulatory Visit: Payer: Self-pay

## 2020-11-21 MED ORDER — LEVETIRACETAM 500 MG PO TABS: 500.0000 mg | ORAL_TABLET | Freq: Two times a day (BID) | ORAL | 0 refills | Status: DC

## 2021-02-18 ENCOUNTER — Encounter: Payer: Self-pay | Admitting: Family Medicine

## 2021-02-18 ENCOUNTER — Ambulatory Visit: Payer: BC Managed Care – PPO | Admitting: Family Medicine

## 2021-02-18 VITALS — BP 119/81 | HR 74 | Ht 67.0 in | Wt 144.0 lb

## 2021-02-18 DIAGNOSIS — G40909 Epilepsy, unspecified, not intractable, without status epilepticus: Secondary | ICD-10-CM | POA: Diagnosis not present

## 2021-02-18 MED ORDER — LEVETIRACETAM 500 MG PO TABS: 500.0000 mg | ORAL_TABLET | Freq: Two times a day (BID) | ORAL | 3 refills | Status: DC

## 2021-02-18 NOTE — Patient Instructions (Signed)
Below is our plan:  We will continue levetiracetam 500mg  twice daily.   Please make sure you are staying well hydrated. I recommend 50-60 ounces daily. Well balanced diet and regular exercise encouraged. Consistent sleep schedule with 6-8 hours recommended.   Please continue follow up with care team as directed.   Follow up with me in 1 year, sooner if needed.   You may receive a survey regarding today's visit. I encourage you to leave honest feed back as I do use this information to improve patient care. Thank you for seeing me today!

## 2021-02-18 NOTE — Progress Notes (Signed)
PATIENT: William Gomez DOB: 10/07/1990  REASON FOR VISIT: follow up HISTORY FROM: patient  Chief Complaint  Patient presents with   Follow-up    Pt alone, rm 16. Pt states overall doing well. Here for medication management. He felt like one was coming on yesterday but didn't have one.      HISTORY OF PRESENT ILLNESS: 02/18/21 ALL: William Gomez returns for follow up for seizures. He was last seen 10/2019. He reports an incident yesterday where his body was achy, he had blurred vision and had no appetite. He was at work and reports having to go home from work. His parents picked him up from work and monitored him for the evening. He denies any seizure activity. No LOC, syncope, or convulsions. He is feeling better today. No fever or viral symptoms. He admits that he has not taken levetiracetam in about 1-2 months. He was due for follow up and also reports transportation issues getting to pharmacy. We last refilled [facility] on 11/21/2020 for 90 days to get him in for follow up. He does not drive. He lives with his parents and a roommate.  11/01/2019 ALL:  William Gomez is a 30 y.o. male here today for follow up for seizures. He continues levetiracetam. He admits that he forgets medication at least once every week. He had an event in 01/2019 where he was seen in the ER for possible seizure activity. Event occurred in setting of being off medication for about a week. No events since. He does not drive. He has a roommate.   HISTORY: (copied from my note on 10/27/2018)  William Gomez is a 30 y.o. male here today for follow up for seizure. He continues levetiracetam 500mg  twice daily.  He is tolerating the medication well with no obvious adverse effects.  He does admit to missing doses on occasion.  There was a period of time during the beginning of the pandemic where he was out of his medication for several weeks.  He states that he has been back on medication consistently for the past 2 to 3 months.   He denies any seizure activity since March 2019.  He does not drive.   HISTORY: (copied from Dr April 2019 note on 10/23/2017)   30 year old male here for evaluation of seizure disorder.   Patient had for seizure of life in 2011.  He felt dizzy and then passed out had generalized convulsions.  Patient went to the emergency room and was diagnosed with seizure disorder.  He was started on Dilantin initially however due to various factors he would typically run out of medication once a year and had breakthrough seizures.  This continued on for several years.  In March 2019 patient went to the emergency room for seizure and he was switched to levetiracetam.   No seizures since March 2019.  Patient tolerating medication.  He does not currently drive but would like to apply for his driver's license in the future.   REVIEW OF SYSTEMS: Out of a complete 14 system review of symptoms, the patient complains only of the following symptoms, seizures and all other reviewed systems are negative.  ALLERGIES: No Known Allergies  HOME MEDICATIONS: Outpatient Medications Prior to Visit  Medication Sig Dispense Refill   levETIRAcetam (KEPPRA) 500 MG tablet Take 1 tablet (500 mg total) by mouth 2 (two) times daily. Please keep upcoming appt for further refills 180 tablet 0   No facility-administered medications prior to visit.    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 No pertinent surgical history.  FAMILY HISTORY: Family History  Problem Relation Age of Onset   Hypertension Mother    Hypertension Father    Cancer Paternal Grandfather     SOCIAL HISTORY: Social History   Socioeconomic History   Marital status: Single    Spouse name: Not on file   Number of children: Not on file   Years of education: Not on file   Highest education level: Not on file  Occupational History   Not on file   Tobacco Use   Smoking status: Light Smoker    Packs/day: 0.11    Years: 1.00    Pack years: 0.11    Types: Cigars, Cigarettes    Last attempt to quit: 03/05/2016    Years since quitting: 4.9   Smokeless tobacco: Never  Substance and Sexual Activity   Alcohol use: Yes    Alcohol/week: 0.0 standard drinks    Comment: occasionally drinks beersocially   Drug use: Not Currently    Types: Marijuana   Sexual activity: Not on file  Other Topics Concern   Not on file  Social History Narrative   Lives at home with parents.  Works at [employer].  Education HS.     Social Determinants of Health   Financial Resource Strain: Not on file  Food Insecurity: Not on file  Transportation Needs: Not on file  Physical Activity: Not on file  Stress: Not on file  Social Connections: Not on file  Intimate Partner Violence: Not on file      PHYSICAL EXAM  Vitals:   02/18/21 1352  BP: 119/81  Pulse: 74  Weight: 144 lb (65.3 kg)  Height: 5\' 7"  (1.702 m)   Body mass index is 22.55 kg/m.  Generalized: Well developed, in no acute distress  Cardiology: normal rate and rhythm, no murmur noted Respiratory: clear to auscultation bilaterally  Neurological examination  Mentation: Alert oriented to time, place, history taking. Follows all commands speech and language fluent Cranial nerve II-XII: Pupils were equal round reactive to light. Extraocular movements were full, visual field were full on confrontational test. Facial sensation and strength were normal. Uvula tongue midline. Head turning and shoulder shrug  were normal and symmetric. Motor: The motor testing reveals 5 over 5 strength of all 4 extremities. Good symmetric motor tone is noted throughout.  Gait and station: Gait is normal.   DIAGNOSTIC DATA (LABS, IMAGING, TESTING) - I reviewed patient records, labs, notes, testing and imaging myself where available.  No flowsheet data found.   Lab Results  Component Value Date   WBC 9.1  01/29/2019   HGB 14.6 01/29/2019   HCT 46.9 01/29/2019   MCV 89.2 01/29/2019   PLT 179 01/29/2019      Component Value Date/Time   NA 138 01/29/2019 0239   K 4.2 01/29/2019 0239   CL 112 (H) 01/29/2019 0239   CO2 23 01/29/2019 0239   GLUCOSE 98 01/29/2019 0239   BUN 11 01/29/2019 0239   CREATININE 0.93 01/29/2019 0239   CALCIUM 8.7 (L) 01/29/2019 0239   PROT 7.1 03/03/2016 1723   ALBUMIN 4.2 03/03/2016 1723   AST 30 03/03/2016 1723   ALT 13 (L) 03/03/2016 1723   ALKPHOS 59 03/03/2016 1723   BILITOT 0.7 03/03/2016 1723   GFRNONAA >60 01/29/2019 0239   GFRAA >60 01/29/2019 0239   Lab Results  Component Value Date   CHOL 160 07/03/2015   HDL 50 07/03/2015   LDLCALC 95 07/03/2015   TRIG 77 07/03/2015   CHOLHDL 3.2 07/03/2015   Lab Results  Component Value Date   HGBA1C 5.2 07/03/2015   No results found for: VITAMINB12     ASSESSMENT AND PLAN 30 y.o. year old male  has a past medical history of Seizure, grand mal (HCC) (2010). here with     ICD-10-CM   1. Seizure disorder (HCC)  G40.909        Quillan continues to tolerate levetiracetam 500mg  twice daily. We have continued discussion regarding the importance of consistency and daily compliance with seizure prevention medication. I have offered to send rx to mail order pharmacy to help with transportation concerns but he wishes for me to send to local [facility]. I have advised that he call me in the future with any concerns of obtaining medications. Seizure precautions advised. Healthy lifestyle habits encouraged. He will follow up with me in 1 year, sooner if needed.    No orders of the defined types were placed in this encounter.    Meds ordered this encounter  Medications   levETIRAcetam (KEPPRA) 500 MG tablet    Sig: Take 1 tablet (500 mg total) by mouth 2 (two) times daily. Please keep upcoming appt for further refills    Dispense:  180 tablet    Refill:  3    Order Specific Question:   Supervising Provider     Answer:   Anson Fret, FNP-C 02/18/2021, 2:18 PM [facility] [address] [place], [place] [phone]

## 2021-05-29 ENCOUNTER — Other Ambulatory Visit: Payer: Self-pay

## 2021-05-29 ENCOUNTER — Emergency Department (HOSPITAL_COMMUNITY): Payer: BC Managed Care – PPO

## 2021-05-29 ENCOUNTER — Encounter (HOSPITAL_COMMUNITY): Payer: Self-pay

## 2021-05-29 ENCOUNTER — Emergency Department (HOSPITAL_COMMUNITY)
Admission: EM | Admit: 2021-05-29 | Discharge: 2021-05-29 | Disposition: A | Discharge status: Home / Self Care | Payer: BC Managed Care – PPO | Attending: Emergency Medicine | Admitting: Emergency Medicine

## 2021-05-29 DIAGNOSIS — S01511A Laceration without foreign body of lip, initial encounter: Secondary | ICD-10-CM | POA: Insufficient documentation

## 2021-05-29 DIAGNOSIS — W228XXA Striking against or struck by other objects, initial encounter: Secondary | ICD-10-CM | POA: Insufficient documentation

## 2021-05-29 DIAGNOSIS — R569 Unspecified convulsions: Secondary | ICD-10-CM | POA: Diagnosis not present

## 2021-05-29 DIAGNOSIS — S0990XA Unspecified injury of head, initial encounter: Secondary | ICD-10-CM | POA: Diagnosis present

## 2021-05-29 LAB — CBC
HCT: 47.8 % (ref 39.0–52.0)
Hemoglobin: 14.7 g/dL (ref 13.0–17.0)
MCH: 27.7 pg (ref 26.0–34.0)
MCHC: 30.8 g/dL (ref 30.0–36.0)
MCV: 90 fL (ref 80.0–100.0)
Platelets: 218 10*3/uL (ref 150–400)
RBC: 5.31 MIL/uL (ref 4.22–5.81)
RDW: 13.2 % (ref 11.5–15.5)
WBC: 8.9 10*3/uL (ref 4.0–10.5)
nRBC: 0 % (ref 0.0–0.2)

## 2021-05-29 LAB — BASIC METABOLIC PANEL
Anion gap: 14 (ref 5–15)
BUN: 10 mg/dL (ref 6–20)
CO2: 22 mmol/L (ref 22–32)
Calcium: 9.3 mg/dL (ref 8.9–10.3)
Chloride: 101 mmol/L (ref 98–111)
Creatinine, Ser: 1.03 mg/dL (ref 0.61–1.24)
GFR, Estimated: >60 mL/min (ref >60)
Glucose, Bld: 121 mg/dL — ABNORMAL HIGH (ref 70–99)
Potassium: 4.1 mmol/L (ref 3.5–5.1)
Sodium: 137 mmol/L (ref 135–145)

## 2021-05-29 MED ORDER — AMOXICILLIN-POT CLAVULANATE 875-125 MG PO TABS: 1.0000 | ORAL_TABLET | Freq: Two times a day (BID) | ORAL | 0 refills | Status: DC

## 2021-05-29 MED ORDER — LIDOCAINE HCL (PF) 1 % IJ SOLN
5.0000 mL | Freq: Once | INTRAMUSCULAR | Status: AC
  Administered 2021-05-29: 5 mL
  Filled 2021-05-29: qty 5

## 2021-05-29 MED ORDER — LEVETIRACETAM IN NACL 1000 MG/100ML IV SOLN
1000.0000 mg | Freq: Once | INTRAVENOUS | Status: AC
  Administered 2021-05-29: 1000 mg via INTRAVENOUS
  Filled 2021-05-29: qty 100

## 2021-05-29 MED ORDER — LEVETIRACETAM 500 MG PO TABS: 500.0000 mg | ORAL_TABLET | Freq: Two times a day (BID) | ORAL | 3 refills | Status: DC

## 2021-05-29 NOTE — ED Notes (Signed)
Patient transported to CT 

## 2021-05-29 NOTE — ED Triage Notes (Signed)
Pt BIB [facility], pt had a witnessed seizure by friend at home. Per pt friend he hit is face on a dresser. Pt has laceration to top lip. Pt is alert and oriented in triage. ? ?Pt has hx of seizures and states he is "somewhat" compliant with his medications.  ?

## 2021-05-29 NOTE — Discharge Instructions (Addendum)
Make sure that you take your Keppra every day.  I sent a refill to your pharmacy.  The sutures in your lip will dissolve, they do not need to be removed.  Take the antibiotics that you do not get an abscess in your lip. ?

## 2021-05-29 NOTE — ED Provider Notes (Signed)
? EMERGENCY DEPARTMENT ?Provider Note ? ? ?CSN: [id number] ?Arrival date & time: 05/29/21  0359 ? ?  ? ?History ? ?Chief Complaint  ?Patient presents with  ? Seizures  ? ? ?William Gomez is a 31 y.o. male. ? ?Patient presents to the emergency department for evaluation after a seizure.  Patient does have a known seizure disorder.  He has brought to the emergency department by EMS from home.  Patient had a witnessed seizure where he hit his face on a dresser. ? ? ?  ? ?Home Medications ?Prior to Admission medications   ?Medication Sig Start Date End Date Taking? Authorizing Provider  ?amoxicillin-clavulanate (AUGMENTIN) 875-125 MG tablet Take 1 tablet by mouth every 12 (twelve) hours. 05/29/21  Yes Seva Chancy, Canary Brimhristopher J, MD  ?levETIRAcetam (KEPPRA) 500 MG tablet Take 1 tablet (500 mg total) by mouth 2 (two) times daily. Please keep upcoming appt for further refills 05/29/21   Elira Colasanti, Canary Brimhristopher J, MD  ?   ? ?Allergies    ?Patient has no known allergies.   ? ?Review of Systems   ?Review of Systems  ?Skin:  Positive for wound.  ?Neurological:  Positive for seizures.  ? ?Physical Exam ?Updated Vital Signs ?BP 124/81   Pulse 67   Temp 98.4 ?F (36.9 ?C) (Axillary)   Resp (!) 21   Ht 5\' 7"  (1.702 m)   Wt 65.8 kg   SpO2 97%   BMI 22.71 kg/m?  ?Physical Exam ?Vitals and nursing note reviewed.  ?Constitutional:   ?   General: He is not in acute distress. ?   Appearance: He is well-developed.  ?HENT:  ?   Head: Normocephalic. Laceration (Upper lip) present.  ?   Mouth/Throat:  ?   Mouth: Mucous membranes are moist.  ?Eyes:  ?   General: Vision grossly intact. Gaze aligned appropriately.  ?   Extraocular Movements: Extraocular movements intact.  ?   Conjunctiva/sclera: Conjunctivae normal.  ?Cardiovascular:  ?   Rate and Rhythm: Normal rate and regular rhythm.  ?   Pulses: Normal pulses.  ?   Heart sounds: Normal heart sounds, S1 normal and S2 normal. No murmur heard. ?  No friction rub. No gallop.   ?Pulmonary:  ?   Effort: Pulmonary effort is normal. No respiratory distress.  ?   Breath sounds: Normal breath sounds.  ?Abdominal:  ?   Palpations: Abdomen is soft.  ?   Tenderness: There is no abdominal tenderness. There is no guarding or rebound.  ?   Hernia: No hernia is present.  ?Musculoskeletal:     ?   General: No swelling.  ?   Cervical back: Full passive range of motion without pain, normal range of motion and neck supple. No pain with movement, spinous process tenderness or muscular tenderness. Normal range of motion.  ?   Right lower leg: No edema.  ?   Left lower leg: No edema.  ?Skin: ?   General: Skin is warm and dry.  ?   Capillary Refill: Capillary refill takes less than 2 seconds.  ?   Findings: Laceration present. No ecchymosis, erythema, lesion or wound.  ?Neurological:  ?   Mental Status: He is alert and oriented to person, place, and time.  ?   GCS: GCS eye subscore is 4. GCS verbal subscore is 5. GCS motor subscore is 6.  ?   Cranial Nerves: Cranial nerves 2-12 are intact.  ?   Sensory: Sensation is intact.  ?   Motor:  Motor function is intact. No weakness or abnormal muscle tone.  ?   Coordination: Coordination is intact.  ?Psychiatric:     ?   Mood and Affect: Mood normal.     ?   Speech: Speech normal.     ?   Behavior: Behavior normal.  ? ? ?ED Results / Procedures / Treatments   ?Labs ?(all labs ordered are listed, but only abnormal results are displayed) ?Labs Reviewed  ?BASIC METABOLIC PANEL - Abnormal; Notable for the following components:  ?    Result Value  ? Glucose, Bld 121 (*)   ? All other components within normal limits  ?CBC  ? ? ?EKG ?None ? ?Radiology ?CT HEAD WO CONTRAST (5MM) ? ?Result Date: 05/29/2021 ?CLINICAL DATA:  30 year old male status post witnessed seizure. Struck face on furniture. Lip laceration. EXAM: CT HEAD WITHOUT CONTRAST TECHNIQUE: Contiguous axial images were obtained from the base of the skull through the vertex without intravenous contrast. RADIATION  DOSE REDUCTION: This exam was performed according to the departmental dose-optimization program which includes automated exposure control, adjustment of the mA and/or kV according to patient size and/or use of iterative reconstruction technique. COMPARISON:  Head CT 09/17/2009.  Brain MRI 01/14/2009. FINDINGS: Brain: Cerebral volume is stable and within normal limits. No midline shift, ventriculomegaly, mass effect, evidence of mass lesion, intracranial hemorrhage or evidence of cortically based acute infarction. Small cavum septum pellucidum, normal variant. Small dilated perivascular spaces in the anterior left frontal lobe on series 2, image 11 are stable from the 2010 MRI (normal variant). Gray-white matter differentiation is within normal limits throughout the brain. Vascular: No suspicious intracranial vascular hyperdensity. Dominant right transverse sinus redemonstrated (normal variant). 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 No acute osseous abnormality identified. Sinuses/Orbits: Left frontal sinuses cleared from the prior exams. Visualized paranasal sinuses and mastoids are clear. Other: No discrete orbit or scalp soft tissue injury. Left periorbital facial piercing. Face CT reported separately. IMPRESSION: 1. Stable and negative noncontrast CT appearance of the brain. 2. No acute traumatic injury identified. Face CT reported separately. Electronically Signed   By: Genevie Ann M.D.   On: 05/29/2021 05:37  ? ?CT MAXILLOFACIAL WO CONTRAST ? ?Result Date: 05/29/2021 ?CLINICAL DATA:  31 year old male status post witnessed seizure. Struck face on furniture. Lip laceration. EXAM: CT MAXILLOFACIAL WITHOUT CONTRAST TECHNIQUE: Multidetector CT imaging of the maxillofacial structures was performed. Multiplanar CT image reconstructions were also generated. RADIATION DOSE REDUCTION: This exam was performed according to the departmental dose-optimization program which includes automated  exposure control, adjustment of the mA and/or kV according to patient size and/or use of iterative reconstruction technique. COMPARISON:  Head CT today, 09/17/2009. FINDINGS: Osseous: Mandible intact and normally located. Poor dentition, and 2 posteriorly impacted molars in the left mandible (series 3, image 68). No maxilla, zygoma, pterygoid plate, or nasal bone fracture. Central skull base appears intact. Congenital incomplete ossification of the posterior C1 ring. Visible cervical vertebrae appear intact. Orbits: No orbital wall fracture. Incidental left lateral periorbital soft tissue piercing. Globes and intraorbital soft tissues appears symmetric and intact. Sinuses: Resolved left frontal sinus opacification seen in 2011. But there is a relatively large 3.6 cm right maxillary sinus mucous retention cyst now. Mild bubbly opacity, mucosal thickening and/or retention cysts in the lower left maxillary sinus also. Tympanic cavities and mastoids are clear. Soft tissues: Negative visible noncontrast deep soft tissue spaces of the face and neck. Trace retained secretions in the left nasopharynx. Mild irregularity  superficial left upper lip compatible with injury on series 3, image 59. Incidental left nasal piercing. Limited intracranial: Reported separately. IMPRESSION: 1. Superficial left upper lip injury. No other acute traumatic injury identified in the Face. 2. Poor dentition. Mild maxillary sinus inflammation, right greater than left maxillary sinus retention cysts. Electronically Signed   By: Genevie Ann M.D.   On: 05/29/2021 05:42   ? ?Procedures ?Marland Kitchen.Laceration Repair ? ?Date/Time: 05/29/2021 5:06 AM ?Performed by: Orpah Greek, MD ?Authorized by: Orpah Greek, MD  ? ?Consent:  ?  Consent obtained:  Verbal ?  Consent given by:  Patient ?  Risks, benefits, and alternatives were discussed: yes   ?  Risks discussed:  Infection and poor cosmetic result ?Universal protocol:  ?  Procedure explained and  questions answered to patient or proxy's satisfaction: yes   ?  Relevant documents present and verified: yes   ?  Test results available: yes   ?  Imaging studies available: yes   ?  Required blood products, implants, de

## 2022-02-18 NOTE — Progress Notes (Unsigned)
PATIENT: William Gomez DOB: 23-Jul-1990  REASON FOR VISIT: follow up HISTORY FROM: patient  No chief complaint on file.    HISTORY OF PRESENT ILLNESS:  02/18/22 ALL: [name] returns for follow up for seizures. He was last seen 02/2021 and had not taken levetiracetam in the previous 1-2 months. He was advised to resume 500mg  BID. He was seen in the ER 05/29/2021 for witnessed seizure with lip injury after reportedly falling on his dresser.   02/18/2021 ALL: [name] returns for follow up for seizures. He was last seen 10/2019. He reports an incident yesterday where his body was achy, he had blurred vision and had no appetite. He was at work and reports having to go home from work. His parents picked him up from work and monitored him for the evening. He denies any seizure activity. No LOC, syncope, or convulsions. He is feeling better today. No fever or viral symptoms. He admits that he has not taken levetiracetam in about 1-2 months. He was due for follow up and also reports transportation issues getting to pharmacy. We last refilled AED on 11/21/2020 for 90 days to get him in for follow up. He does not drive. He lives with his parents and a roommate.  11/01/2019 ALL:  [name] is a 31 y.o. male here today for follow up for seizures. He continues levetiracetam. He admits that he forgets medication at least once every week. He had an event in 01/2019 where he was seen in the ER for possible seizure activity. Event occurred in setting of being off medication for about a week. No events since. He does not drive. He has a roommate.   HISTORY: (copied from my note on 10/27/2018)  [name] is a 31 y.o. male here today for follow up for seizure. He continues levetiracetam 500mg  twice daily.  He is tolerating the medication well with no obvious adverse effects.  He does admit to missing doses on occasion.  There was a period of time during the beginning of the pandemic where he was out  of his medication for several weeks.  He states that he has been back on medication consistently for the past 2 to 3 months.  He denies any seizure activity since March 2019.  He does not drive.   HISTORY: (copied from Dr note on 10/23/2017)   31 year old male here for evaluation of seizure disorder.   Patient had for seizure of life in 2011.  He felt dizzy and then passed out had generalized convulsions.  Patient went to the emergency room and was diagnosed with seizure disorder.  He was started on Dilantin initially however due to various factors he would typically run out of medication once a year and had breakthrough seizures.  This continued on for several years.  In March 2019 patient went to the emergency room for seizure and he was switched to levetiracetam.   No seizures since March 2019.  Patient tolerating medication.  He does not currently drive but would like to apply for his driver's license in the future.   REVIEW OF SYSTEMS: Out of a complete 14 system review of symptoms, the patient complains only of the following symptoms, seizures and all other reviewed systems are negative.  ALLERGIES: No Known Allergies  HOME MEDICATIONS: Outpatient Medications Prior to Visit  Medication Sig Dispense Refill   amoxicillin-clavulanate (AUGMENTIN) 875-125 MG tablet Take 1 tablet by mouth every 12 (twelve) hours. 14 tablet 0   levETIRAcetam (KEPPRA)  500 MG tablet Take 1 tablet (500 mg total) by mouth 2 (two) times daily. Please keep upcoming appt for further refills 180 tablet 3   No facility-administered medications prior to visit.    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  FAMILY HISTORY: Family History  Problem Relation Age of Onset   Hypertension Mother    Hypertension Father    Cancer Paternal Grandfather     SOCIAL HISTORY: Social History    Socioeconomic History   Marital status: Single    Spouse name: Not on file   Number of children: Not on file   Years of education: Not on file   Highest education level: Not on file  Occupational History   Not on file  Tobacco Use   Smoking status: Light Smoker    Packs/day: 0.11    Years: 1.00    Total pack years: 0.11    Types: Cigars, Cigarettes    Last attempt to quit: 03/05/2016    Years since quitting: 5.9   Smokeless tobacco: Never  Substance and Sexual Activity   Alcohol use: Yes    Alcohol/week: 0.0 standard drinks of alcohol    Comment: occasionally drinks beersocially   Drug use: Not Currently    Types: Marijuana   Sexual activity: Not on file  Other Topics Concern   Not on file  Social History Narrative   Lives at home with parents.  Works at [employer].  Education HS.     Social Determinants of Health   Financial Resource Strain: Not on file  Food Insecurity: Not on file  Transportation Needs: Not on file  Physical Activity: Not on file  Stress: Not on file  Social Connections: Not on file  Intimate Partner Violence: Not on file      PHYSICAL EXAM  There were no vitals filed for this visit.  There is no height or weight on file to calculate BMI.  Generalized: Well developed, in no acute distress  Cardiology: normal rate and rhythm, no murmur noted Respiratory: clear to auscultation bilaterally  Neurological examination  Mentation: Alert oriented to time, place, history taking. Follows all commands speech and language fluent Cranial nerve II-XII: Pupils were equal round reactive to light. Extraocular movements were full, visual field were full on confrontational test. Facial sensation and strength were normal. Uvula tongue midline. Head turning and shoulder shrug  were normal and symmetric. Motor: The motor testing reveals 5 over 5 strength of all 4 extremities. Good symmetric motor tone is noted throughout.  Gait and station: Gait is normal.    DIAGNOSTIC DATA (LABS, IMAGING, TESTING) - I reviewed patient records, labs, notes, testing and imaging myself where available.      No data to display           Lab Results  Component Value Date   WBC 8.9 05/29/2021   HGB 14.7 05/29/2021   HCT 47.8 05/29/2021   MCV 90.0 05/29/2021   PLT 218 05/29/2021      Component Value Date/Time   NA 137 05/29/2021 0410   K 4.1 05/29/2021 0410   CL 101 05/29/2021 0410   CO2 22 05/29/2021 0410   GLUCOSE 121 (H) 05/29/2021 0410   BUN 10 05/29/2021 0410   CREATININE 1.03 05/29/2021 0410   CALCIUM 9.3 05/29/2021 0410   PROT 7.1 03/03/2016 1723   ALBUMIN 4.2 03/03/2016 1723  AST 30 03/03/2016 1723   ALT 13 (L) 03/03/2016 1723   ALKPHOS 59 03/03/2016 1723   BILITOT 0.7 03/03/2016 1723   GFRNONAA >60 05/29/2021 0410   GFRAA >60 01/29/2019 0239   Lab Results  Component Value Date   CHOL 160 07/03/2015   HDL 50 07/03/2015   LDLCALC 95 07/03/2015   TRIG 77 07/03/2015   CHOLHDL 3.2 07/03/2015   Lab Results  Component Value Date   HGBA1C 5.2 07/03/2015   No results found for: "VITAMINB12"     ASSESSMENT AND PLAN 31 y.o. year old male  has a past medical history of Seizure, grand mal (HCC) (2010). here with   No diagnosis found.    Nuchem continues to tolerate levetiracetam 500mg  twice daily. We have continued discussion regarding the importance of consistency and daily compliance with seizure prevention medication. I have offered to send rx to mail order pharmacy to help with transportation concerns but he wishes for me to send to local [facility]. I have advised that he call me in the future with any concerns of obtaining medications. Seizure precautions advised. Healthy lifestyle habits encouraged. He will follow up with me in 1 year, sooner if needed.    No orders of the defined types were placed in this encounter.     No orders of the defined types were placed in this encounter.      , FNP-C 02/18/2022,  4:05 PM [facility] [address] [place], [place] [id number]

## 2022-02-18 NOTE — Patient Instructions (Signed)
Below is our plan:  We will continue levetiracetam mg twice daily.   Please make sure you are consistent with timing of seizure medication. I recommend annual visit with primary care provider (PCP) for complete physical and routine blood work. I recommend daily intake of vitamin D (400-800iu) and calcium (800-1000mg ) for bone health. Discuss Dexa screening with PCP.   According to [place] law, you can not drive unless you are seizure / syncope free for at least 6 months and under physician's care.  Please maintain precautions. Do not participate in activities where a loss of awareness could harm you or someone else. No swimming alone, no tub bathing, no hot tubs, no driving, no operating motorized vehicles (cars, ATVs, motocycles, etc), lawnmowers, power tools or firearms. No standing at heights, such as rooftops, ladders or stairs. Avoid hot objects such as stoves, heaters, open fires. Wear a helmet when riding a bicycle, scooter, skateboard, etc. and avoid areas of traffic. Set your water heater to 120 degrees or less.  Please make sure you are staying well hydrated. I recommend 50-60 ounces daily. Well balanced diet and regular exercise encouraged. Consistent sleep schedule with 6-8 hours recommended.   Please continue follow up with care team as directed.   Follow up with me in 1 year   You may receive a survey regarding today's visit. I encourage you to leave honest feed back as I do use this information to improve patient care. Thank you for seeing me today!

## 2022-02-19 ENCOUNTER — Encounter: Payer: Self-pay | Admitting: Family Medicine

## 2022-02-19 ENCOUNTER — Ambulatory Visit: Payer: BC Managed Care – PPO | Admitting: Family Medicine

## 2022-02-19 VITALS — BP 139/84 | HR 67 | Ht 66.0 in | Wt 140.0 lb

## 2022-02-19 DIAGNOSIS — G40909 Epilepsy, unspecified, not intractable, without status epilepticus: Secondary | ICD-10-CM

## 2022-02-19 MED ORDER — LEVETIRACETAM 500 MG PO TABS: 500.0000 mg | ORAL_TABLET | Freq: Two times a day (BID) | ORAL | 3 refills | Status: DC

## 2023-02-24 ENCOUNTER — Telehealth: Payer: Self-pay | Admitting: *Deleted

## 2023-02-24 MED ORDER — LEVETIRACETAM 500 MG PO TABS: 500.0000 mg | ORAL_TABLET | Freq: Two times a day (BID) | ORAL | 0 refills | Status: DC

## 2023-02-24 NOTE — Patient Instructions (Incomplete)
Below is our plan:  We will ***  Please make sure you are consistent with timing of seizure medication. I recommend annual visit with primary care provider (PCP) for complete physical and routine blood work. I recommend daily intake of vitamin D (400-800iu) and calcium (800-1000mg) for bone health. Discuss Dexa screening with PCP.   According to [place] law, you can not drive unless you are seizure / syncope free for at least 6 months and under physician's care.  Please maintain precautions. Do not participate in activities where a loss of awareness could harm you or someone else. No swimming alone, no tub bathing, no hot tubs, no driving, no operating motorized vehicles (cars, ATVs, motocycles, etc), lawnmowers, power tools or firearms. No standing at heights, such as rooftops, ladders or stairs. Avoid hot objects such as stoves, heaters, open fires. Wear a helmet when riding a bicycle, scooter, skateboard, etc. and avoid areas of traffic. Set your water heater to 120 degrees or less.  SUDEP is the sudden, unexpected death of someone with epilepsy, who was otherwise healthy. In SUDEP cases, no other cause of death is found when an autopsy is done. Each year, more than 1 in 1,000 people with epilepsy die from SUDEP. This is the leading cause of death in people with uncontrolled seizures. Until further answers are available, the best way to prevent SUDEP is to lower your risk by controlling seizures. Research has found that people with all types of epilepsy that experience convulsive seizures can be at risk.  Please make sure you are staying well hydrated. I recommend 50-60 ounces daily. Well balanced diet and regular exercise encouraged. Consistent sleep schedule with 6-8 hours recommended.   Please continue follow up with care team as directed.   Follow up with *** in ***  You may receive a survey regarding today's visit. I encourage you to leave honest feed back as I do use this information to improve  patient care. Thank you for seeing me today!    

## 2023-02-24 NOTE — Telephone Encounter (Signed)
Last seen on 02/19/22 No 1 year follow up scheduled

## 2023-02-24 NOTE — Progress Notes (Unsigned)
PATIENT: William Gomez DOB: Aug 06, 1990  REASON FOR VISIT: follow up HISTORY FROM: patient  No chief complaint on file.    HISTORY OF PRESENT ILLNESS:  02/24/23 ALL: William Gomez returns for follow up for seizures. He was last seen 02/2022 and was doing well on levetiracetam 500mg  BID.   Last seizure 05/2021.   02/19/2022 ALL:  William Gomez returns for follow up for seizures. He was last seen 02/2021 and had not taken levetiracetam in the previous 1-2 months. He was advised to resume 500mg  BID. He was seen in the ER 05/29/2021 for witnessed seizure with lip injury after reportedly falling on his dresser. He reports no seizure activity since. He has taken ASM consistently. He denies recently missed doses. He is feeling well. No concerns. He does not drive.   02/18/2021 ALL: William Gomez returns for follow up for seizures. He was last seen 10/2019. He reports an incident yesterday where his body was achy, he had blurred vision and had no appetite. He was at work and reports having to go home from work. His parents picked him up from work and monitored him for the evening. He denies any seizure activity. No LOC, syncope, or convulsions. He is feeling better today. No fever or viral symptoms. He admits that he has not taken levetiracetam in about 1-2 months. He was due for follow up and also reports transportation issues getting to pharmacy. We last refilled AED on 11/21/2020 for 90 days to get him in for follow up. He does not drive. He lives with his parents and a roommate.  11/01/2019 ALL:  William Gomez is a 32 y.o. male here today for follow up for seizures. He continues levetiracetam. He admits that he forgets medication at least once every week. He had an event in 01/2019 where he was seen in the ER for possible seizure activity. Event occurred in setting of being off medication for about a week. No events since. He does not drive. He has a roommate.   HISTORY: (copied from my note on  10/27/2018)  William Gomez is a 32 y.o. male here today for follow up for seizure. He continues levetiracetam 500mg  twice daily.  He is tolerating the medication well with no obvious adverse effects.  He does admit to missing doses on occasion.  There was a period of time during the beginning of the pandemic where he was out of his medication for several weeks.  He states that he has been back on medication consistently for the past 2 to 3 months.  He denies any seizure activity since March 2019.  He does not drive.   HISTORY: (copied from Dr Richrd Humbles note on 10/23/2017)   32 year old male here for evaluation of seizure disorder.   Patient had for seizure of life in 2011.  He felt dizzy and then passed out had generalized convulsions.  Patient went to the emergency room and was diagnosed with seizure disorder.  He was started on Dilantin initially however due to various factors he would typically run out of medication once a year and had breakthrough seizures.  This continued on for several years.  In March 2019 patient went to the emergency room for seizure and he was switched to levetiracetam.   No seizures since March 2019.  Patient tolerating medication.  He does not currently drive but would like to apply for his driver's license in the future.   REVIEW OF SYSTEMS: Out of a complete 14 system review of symptoms, the  patient complains only of the following symptoms, seizures and all other reviewed systems are negative.  ALLERGIES: No Known Allergies  HOME MEDICATIONS: Outpatient Medications Prior to Visit  Medication Sig Dispense Refill   amoxicillin-clavulanate (AUGMENTIN) 875-125 MG tablet Take 1 tablet by mouth every 12 (twelve) hours. (Patient not taking: Reported on 02/19/2022) 14 tablet 0   levETIRAcetam (KEPPRA) 500 MG tablet Take 1 tablet (500 mg total) by mouth 2 (two) times daily. Please keep upcoming appt for further refills 60 tablet 0   No facility-administered medications  prior to visit.    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  FAMILY HISTORY: Family History  Problem Relation Age of Onset   Hypertension Mother    Hypertension Father    Cancer Paternal Grandfather     SOCIAL HISTORY: Social History   Socioeconomic History   Marital status: Single    Spouse name: Not on file   Number of children: Not on file   Years of education: Not on file   Highest education level: Not on file  Occupational History   Not on file  Tobacco Use   Smoking status: Light Smoker    Current packs/day: 0.00    Average packs/day: 0.1 packs/day for 1 year (0.1 ttl pk-yrs)    Types: Cigars, Cigarettes    Start date: 03/06/2015    Last attempt to quit: 03/05/2016    Years since quitting: 6.9   Smokeless tobacco: Never  Substance and Sexual Activity   Alcohol use: Yes    Alcohol/week: 0.0 standard drinks of alcohol    Comment: occasionally drinks beersocially   Drug use: Not Currently    Types: Marijuana   Sexual activity: Not on file  Other Topics Concern   Not on file  Social History Narrative   Lives at home with parents.  Works at [employer].  Education HS.     Social Determinants of Health   Financial Resource Strain: Not on file  Food Insecurity: Not on file  Transportation Needs: Not on file  Physical Activity: Not on file  Stress: Not on file  Social Connections: Not on file  Intimate Partner Violence: Not on file      PHYSICAL EXAM  There were no vitals filed for this visit.   There is no height or weight on file to calculate BMI.  Generalized: Well developed, in no acute distress  Cardiology: normal rate and rhythm, no murmur noted Respiratory: clear to auscultation bilaterally  Neurological examination  Mentation: Alert oriented to time, place, history taking. Follows all commands speech and language fluent Cranial  nerve II-XII: Pupils were equal round reactive to light. Extraocular movements were full, visual field were full on confrontational test. Facial sensation and strength were normal. Uvula tongue midline. Head turning and shoulder shrug  were normal and symmetric. Motor: The motor testing reveals 5 over 5 strength of all 4 extremities. Good symmetric motor tone is noted throughout.  Gait and station: Gait is normal.   DIAGNOSTIC DATA (LABS, IMAGING, TESTING) - I reviewed patient records, labs, notes, testing and imaging myself where available.      No data to display           Lab Results  Component Value Date   WBC 8.9 05/29/2021   HGB 14.7 05/29/2021   HCT 47.8 05/29/2021   MCV 90.0 05/29/2021   PLT 218  05/29/2021      Component Value Date/Time   NA 137 05/29/2021 0410   K 4.1 05/29/2021 0410   CL 101 05/29/2021 0410   CO2 22 05/29/2021 0410   GLUCOSE 121 (H) 05/29/2021 0410   BUN 10 05/29/2021 0410   CREATININE 1.03 05/29/2021 0410   CALCIUM 9.3 05/29/2021 0410   PROT 7.1 03/03/2016 1723   ALBUMIN 4.2 03/03/2016 1723   AST 30 03/03/2016 1723   ALT 13 (L) 03/03/2016 1723   ALKPHOS 59 03/03/2016 1723   BILITOT 0.7 03/03/2016 1723   GFRNONAA >60 05/29/2021 0410   GFRAA >60 01/29/2019 0239   Lab Results  Component Value Date   CHOL 160 07/03/2015   HDL 50 07/03/2015   LDLCALC 95 07/03/2015   TRIG 77 07/03/2015   CHOLHDL 3.2 07/03/2015   Lab Results  Component Value Date   HGBA1C 5.2 07/03/2015   No results found for: "VITAMINB12"     ASSESSMENT AND PLAN 32 y.o. year old male  has a past medical history of Seizure, grand mal (HCC) (2010). here with   No diagnosis found.   Avyn continues to tolerate levetiracetam 500mg  twice daily. We have continued discussion regarding the importance of consistency and daily compliance with seizure prevention medication. I have offered to send rx to mail order pharmacy to help with transportation concerns but he wishes  for me to send to local [facility]. I have advised that he call me in the future with any concerns of obtaining medications. Seizure precautions advised. Healthy lifestyle habits encouraged. He will follow up with me in 1 year, sooner if needed.    No orders of the defined types were placed in this encounter.     No orders of the defined types were placed in this encounter.      Shawnie Dapper, FNP-C 02/24/2023, 1:37 PM [facility] [address], [address] [place], [place] [address] [phone]

## 2023-02-25 ENCOUNTER — Telehealth: Payer: Self-pay | Admitting: Family Medicine

## 2023-02-25 ENCOUNTER — Ambulatory Visit: Payer: BC Managed Care – PPO | Admitting: Family Medicine

## 2023-02-25 DIAGNOSIS — G40909 Epilepsy, unspecified, not intractable, without status epilepticus: Secondary | ICD-10-CM

## 2023-02-25 NOTE — Telephone Encounter (Signed)
Patient rescheduled for 05/12/22

## 2023-02-25 NOTE — Telephone Encounter (Signed)
Rx was approved and sent to pharmacy on 02/24/23.

## 2023-02-25 NOTE — Telephone Encounter (Signed)
Pt called and r/s yr appt f/u Pt is needing a refill on his levETIRAcetam (KEPPRA) 500 MG tablet sent to the [facility] in [place]

## 2023-02-25 NOTE — Telephone Encounter (Signed)
Pt had to cancel today's appointment

## 2023-04-02 ENCOUNTER — Telehealth: Payer: Self-pay | Admitting: Family Medicine

## 2023-04-02 MED ORDER — LEVETIRACETAM 500 MG PO TABS: 500.0000 mg | ORAL_TABLET | Freq: Two times a day (BID) | ORAL | 0 refills | Status: DC

## 2023-04-02 NOTE — Telephone Encounter (Signed)
Pt requesting refill of levETIRAcetam (KEPPRA) 500 MG tablet  Send to [facility] [phone]

## 2023-04-02 NOTE — Telephone Encounter (Signed)
 Refill sent until appt is completed

## 2023-05-04 ENCOUNTER — Telehealth: Payer: Self-pay | Admitting: Family Medicine

## 2023-05-04 MED ORDER — LEVETIRACETAM 500 MG PO TABS
500.0000 mg | ORAL_TABLET | Freq: Two times a day (BID) | ORAL | 0 refills | Status: DC
Start: 2023-05-04 — End: 2023-05-13

## 2023-05-04 NOTE — Telephone Encounter (Addendum)
 Pt last seen 02/19/22 and next f/u 05/13/23 w/ AL.NP. E-scribed #60, 0 refills to get him through until f/u.

## 2023-05-04 NOTE — Telephone Encounter (Signed)
Pt is requesting a refill for levETIRAcetam (KEPPRA) 500 MG tablet.  Pharmacy:  [facility] [phone]

## 2023-05-12 NOTE — Patient Instructions (Signed)
 Below is our plan:  We will continue levetiracetam but will change tablet to an extended release tablet. You will take 2 tablets (1000mg ) daily. Let me know if you can't get extended release medication and we can switch back.   Please make sure you are consistent with timing of seizure medication. I recommend annual visit with primary care provider (PCP) for complete physical and routine blood work. I recommend daily intake of vitamin D (400-800iu) and calcium (800-1000mg ) for bone health. Discuss Dexa screening with PCP.   According to [place] law, you can not drive unless you are seizure / syncope free for at least 6 months and under physician's care.  Please maintain precautions. Do not participate in activities where a loss of awareness could harm you or someone else. No swimming alone, no tub bathing, no hot tubs, no driving, no operating motorized vehicles (cars, ATVs, motocycles, etc), lawnmowers, power tools or firearms. No standing at heights, such as rooftops, ladders or stairs. Avoid hot objects such as stoves, heaters, open fires. Wear a helmet when riding a bicycle, scooter, skateboard, etc. and avoid areas of traffic. Set your water heater to 120 degrees or less.  SUDEP is the sudden, unexpected death of someone with epilepsy, who was otherwise healthy. In SUDEP cases, no other cause of death is found when an autopsy is done. Each year, more than 1 in 1,000 people with epilepsy die from SUDEP. This is the leading cause of death in people with uncontrolled seizures. Until further answers are available, the best way to prevent SUDEP is to lower your risk by controlling seizures. Research has found that people with all types of epilepsy that experience convulsive seizures can be at risk.  Please make sure you are staying well hydrated. I recommend 50-60 ounces daily. Well balanced diet and regular exercise encouraged. Consistent sleep schedule with 6-8 hours recommended.   Please continue follow  up with care team as directed.   Follow up with me in 1 year   You may receive a survey regarding today's visit. I encourage you to leave honest feed back as I do use this information to improve patient care. Thank you for seeing me today!

## 2023-05-12 NOTE — Progress Notes (Unsigned)
 PATIENT: William Gomez DOB: 1990-11-13  REASON FOR VISIT: follow up HISTORY FROM: patient  No chief complaint on file.    HISTORY OF PRESENT ILLNESS:  05/12/23 ALL: William Gomez returns for follow up for seizures. He was last seen 02/2021 and non compliant with levetiracetam. He had seizure event 05/2021 but reported he had been compliant with ASM since. Today, he reports doing   02/19/2022 ALL:  William Gomez returns for follow up for seizures. He was last seen 02/2021 and had not taken levetiracetam in the previous 1-2 months. He was advised to resume 500mg  BID. He was seen in the ER 05/29/2021 for witnessed seizure with lip injury after reportedly falling on his dresser. He reports no seizure activity since. He has taken ASM consistently. He denies recently missed doses. He is feeling well. No concerns. He does not drive.   02/18/2021 ALL: William Gomez returns for follow up for seizures. He was last seen 10/2019. He reports an incident yesterday where his body was achy, he had blurred vision and had no appetite. He was at work and reports having to go home from work. His parents picked him up from work and monitored him for the evening. He denies any seizure activity. No LOC, syncope, or convulsions. He is feeling better today. No fever or viral symptoms. He admits that he has not taken levetiracetam in about 1-2 months. He was due for follow up and also reports transportation issues getting to pharmacy. We last refilled AED on 11/21/2020 for 90 days to get him in for follow up. He does not drive. He lives with his parents and a roommate.  11/01/2019 ALL:  William Gomez is a 33 y.o. male here today for follow up for seizures. He continues levetiracetam. He admits that he forgets medication at least once every week. He had an event in 01/2019 where he was seen in the ER for possible seizure activity. Event occurred in setting of being off medication for about a week. No events since. He does not drive. He  has a roommate.   HISTORY: (copied from my note on 10/27/2018)  William Gomez is a 33 y.o. male here today for follow up for seizure. He continues levetiracetam 500mg  twice daily.  He is tolerating the medication well with no obvious adverse effects.  He does admit to missing doses on occasion.  There was a period of time during the beginning of the pandemic where he was out of his medication for several weeks.  He states that he has been back on medication consistently for the past 2 to 3 months.  He denies any seizure activity since March 2019.  He does not drive.   HISTORY: (copied from Dr William Gomez note on 10/23/2017)   33 year old male here for evaluation of seizure disorder.   Patient had for seizure of life in 2011.  He felt dizzy and then passed out had generalized convulsions.  Patient went to the emergency room and was diagnosed with seizure disorder.  He was started on Dilantin initially however due to various factors he would typically run out of medication once a year and had breakthrough seizures.  This continued on for several years.  In March 2019 patient went to the emergency room for seizure and he was switched to levetiracetam.   No seizures since March 2019.  Patient tolerating medication.  He does not currently drive but would like to apply for his driver's license in the future.   REVIEW OF SYSTEMS:  Out of a complete 14 system review of symptoms, the patient complains only of the following symptoms, seizures and all other reviewed systems are negative.  ALLERGIES: No Known Allergies  HOME MEDICATIONS: Outpatient Medications Prior to Visit  Medication Sig Dispense Refill   levETIRAcetam (KEPPRA) 500 MG tablet Take 1 tablet (500 mg total) by mouth 2 (two) times daily. Must keep appt 05/13/23 for ongoing refills. 60 tablet 0   No facility-administered medications prior to visit.    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  FAMILY HISTORY: Family History  Problem Relation Age of Onset   Hypertension Mother    Hypertension Father    Cancer Paternal Grandfather     SOCIAL HISTORY: Social History   Socioeconomic History   Marital status: Single    Spouse name: Not on file   Number of children: Not on file   Years of education: Not on file   Highest education level: Not on file  Occupational History   Not on file  Tobacco Use   Smoking status: Light Smoker    Current packs/day: 0.00    Average packs/day: 0.1 packs/day for 1 year (0.1 ttl pk-yrs)    Types: Cigars, Cigarettes    Start date: 03/06/2015    Last attempt to quit: 03/05/2016    Years since quitting: 7.1   Smokeless tobacco: Never  Substance and Sexual Activity   Alcohol use: Yes    Alcohol/week: 0.0 standard drinks of alcohol    Comment: occasionally drinks beersocially   Drug use: Not Currently    Types: Marijuana   Sexual activity: Not on file  Other Topics Concern   Not on file  Social History Narrative   Lives at home with parents.  Works at [employer].  Education HS.     Social Drivers of Corporate investment banker Strain: Not on file  Food Insecurity: Not on file  Transportation Needs: Not on file  Physical Activity: Not on file  Stress: Not on file  Social Connections: Not on file  Intimate Partner Violence: Not on file      PHYSICAL EXAM  There were no vitals filed for this visit.   There is no height or weight on file to calculate BMI.  Generalized: Well developed, in no acute distress  Cardiology: normal rate and rhythm, no murmur noted Respiratory: clear to auscultation bilaterally  Neurological examination  Mentation: Alert oriented to time, place, history taking. Follows all commands speech and language fluent Cranial nerve II-XII: Pupils were equal round reactive to light. Extraocular movements were full, visual field were full  on confrontational test. Facial sensation and strength were normal. Uvula tongue midline. Head turning and shoulder shrug  were normal and symmetric. Motor: The motor testing reveals 5 over 5 strength of all 4 extremities. Good symmetric motor tone is noted throughout.  Gait and station: Gait is normal.   DIAGNOSTIC DATA (LABS, IMAGING, TESTING) - I reviewed patient records, labs, notes, testing and imaging myself where available.      No data to display           Lab Results  Component Value Date   WBC 8.9 05/29/2021   HGB 14.7 05/29/2021   HCT 47.8 05/29/2021   MCV 90.0 05/29/2021   PLT 218 05/29/2021      Component Value Date/Time   NA 137 05/29/2021 0410  K 4.1 05/29/2021 0410   CL 101 05/29/2021 0410   CO2 22 05/29/2021 0410   GLUCOSE 121 (H) 05/29/2021 0410   BUN 10 05/29/2021 0410   CREATININE 1.03 05/29/2021 0410   CALCIUM 9.3 05/29/2021 0410   PROT 7.1 03/03/2016 1723   ALBUMIN 4.2 03/03/2016 1723   AST 30 03/03/2016 1723   ALT 13 (L) 03/03/2016 1723   ALKPHOS 59 03/03/2016 1723   BILITOT 0.7 03/03/2016 1723   GFRNONAA >60 05/29/2021 0410   GFRAA >60 01/29/2019 0239   Lab Results  Component Value Date   CHOL 160 07/03/2015   HDL 50 07/03/2015   LDLCALC 95 07/03/2015   TRIG 77 07/03/2015   CHOLHDL 3.2 07/03/2015   Lab Results  Component Value Date   HGBA1C 5.2 07/03/2015   No results found for: "VITAMINB12"     ASSESSMENT AND PLAN 33 y.o. year old male  has a past medical history of Seizure, grand mal (HCC) (2010). here with   No diagnosis found.   Nathin continues to tolerate levetiracetam 500mg  twice daily. We have continued discussion regarding the importance of consistency and daily compliance with seizure prevention medication. I have offered to send rx to mail order pharmacy to help with transportation concerns but he wishes for me to send to local [facility]. I have advised that he call me in the future with any concerns of obtaining  medications. Seizure precautions advised. Healthy lifestyle habits encouraged. He will follow up with me in 1 year, sooner if needed.    No orders of the defined types were placed in this encounter.     No orders of the defined types were placed in this encounter.      Shawnie Dapper, FNP-C 05/12/2023, 9:36 AM [facility] [address], [address] [place], [place] [address] [phone]

## 2023-05-13 ENCOUNTER — Ambulatory Visit: Payer: BC Managed Care – PPO | Admitting: Family Medicine

## 2023-05-13 ENCOUNTER — Encounter: Payer: Self-pay | Admitting: Family Medicine

## 2023-05-13 VITALS — BP 107/71 | HR 80 | Ht 67.0 in | Wt 143.0 lb

## 2023-05-13 DIAGNOSIS — G40909 Epilepsy, unspecified, not intractable, without status epilepticus: Secondary | ICD-10-CM

## 2023-05-13 DIAGNOSIS — Z758 Other problems related to medical facilities and other health care: Secondary | ICD-10-CM | POA: Diagnosis not present

## 2023-05-13 MED ORDER — LEVETIRACETAM ER 500 MG PO TB24: 1000.0000 mg | ORAL_TABLET | Freq: Every day | ORAL | 3 refills | Status: AC

## 2023-06-11 ENCOUNTER — Telehealth: Payer: Self-pay | Admitting: Family Medicine

## 2023-06-11 NOTE — Telephone Encounter (Signed)
 Called [facility]. Spoke w/ Brett Canales. He confirmed they have below rx on file with refills for pt. I called pt back and updated him. He will contact pharmacy to get refill processed.

## 2023-06-11 NOTE — Telephone Encounter (Signed)
 Last saw Amy 05/13/23 and next f/u 05/12/24.

## 2023-06-11 NOTE — Telephone Encounter (Signed)
 Pt request refill for levETIRAcetam (KEPPRA XR) 500 MG 24 hr tablet send to [facility] [phone]

## 2023-06-12 NOTE — Telephone Encounter (Signed)
 Pt called stating that with the increase in dosage his insurance will not cover it any longer and it will be $500. Pt states that he can not afford that and is completely out of the medication. Please advise.

## 2023-06-12 NOTE — Telephone Encounter (Signed)
 I called and spoke with pharmacy tech and it appears pt has multiple profiles under his name and insurance was not attached to correct profile. Tech said pt will need to come and speak with pharmacist to delete multiple profiles. Correct profile was found and medication will be $18.52 and not $504.   I called patient and informed him of the cost of medication and he will need to speak with pharmacy about the multiple profiles.    Pt verbalized he understood.

## 2023-08-19 ENCOUNTER — Telehealth: Payer: Self-pay | Admitting: Family Medicine

## 2023-08-19 NOTE — Telephone Encounter (Signed)
 Pt is asking for a letter for his job stating he has been continually taking his  levETIRAcetam  (KEPPRA  XR) 500 MG 24 hr tablet, this is needed so he is able to use equipment : a Walkie and  turnie, please call pt to discuss further. The fax # the letter needs to be sent to is [phone] attention [employer]

## 2023-08-20 ENCOUNTER — Encounter: Payer: Self-pay | Admitting: Family Medicine

## 2023-08-20 NOTE — Telephone Encounter (Signed)
 Called pt and asked him what exactly did his letter need to say for his job. Pt states that his note needs to say that he is medically cleared to use equipment at his job. Will ask Amy about note

## 2023-08-20 NOTE — Telephone Encounter (Signed)
 Called pt back per Amy Lomax and asked him if he has had any recent Seizure activity and pt stated that he hasn't. Also asked pt if he was taking his medication as prescribed and haven't missed any doses of his Keppra  XR 500mg  24hr tablets, pt states that he hasn't missed a dose and is tolerating his medication well, no complaints.   Sending to NP

## 2023-08-20 NOTE — Telephone Encounter (Signed)
 LVM letting pt know that his letter for his job is in his mychart.

## 2024-02-05 IMAGING — CT CT MAXILLOFACIAL W/O CM
3 series · 14 of 47 positions shown, 16 images · non-contrast
Comparison: Head CT today, 09/17/2009.

CLINICAL DATA: 31-year-old male status post witnessed seizure.
Struck face on furniture. Lip laceration.



[Series 2: max soft · axial · 0.34mm/px · z∈[+66,+228]mm · 8 of 95 slices shown, 10 images]
[im 7/95  brain]
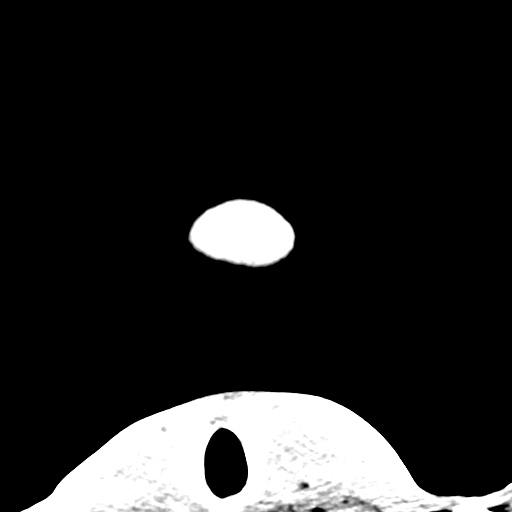
[im 7/95  bone]
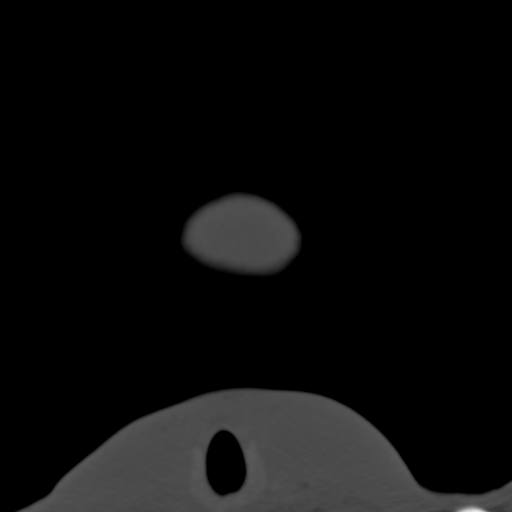
[im 20/95  bone]
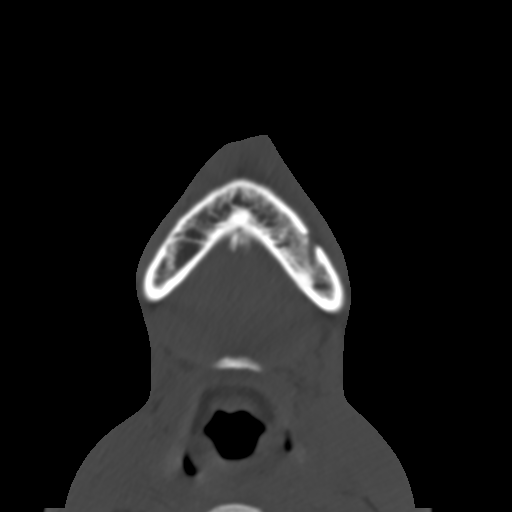
[im 30/95  bone]
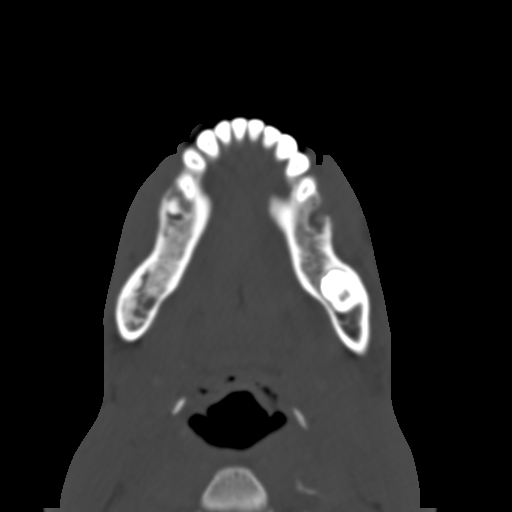
[im 43/95  bone]
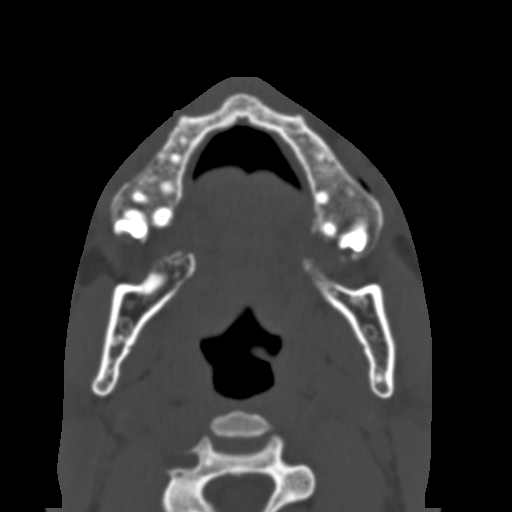
[im 52/95  brain]
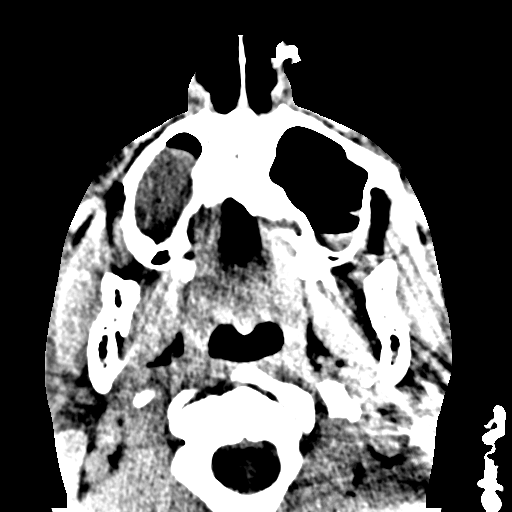
[im 52/95  bone]
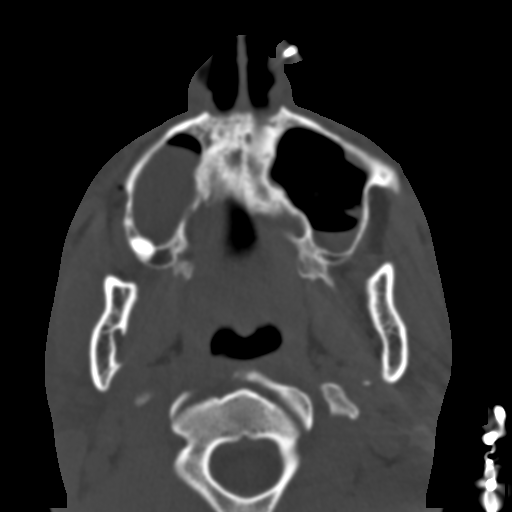
[im 65/95  bone]
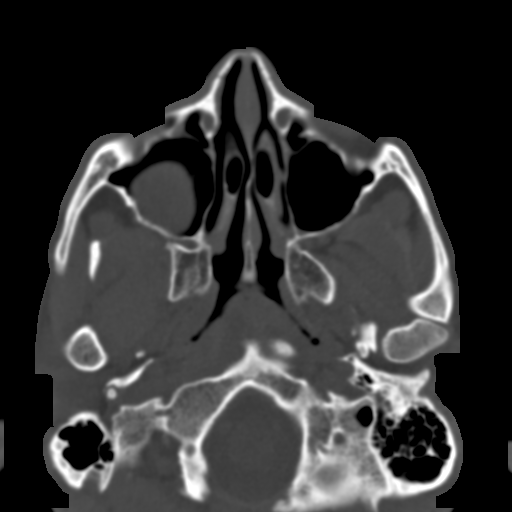
[im 75/95  bone]
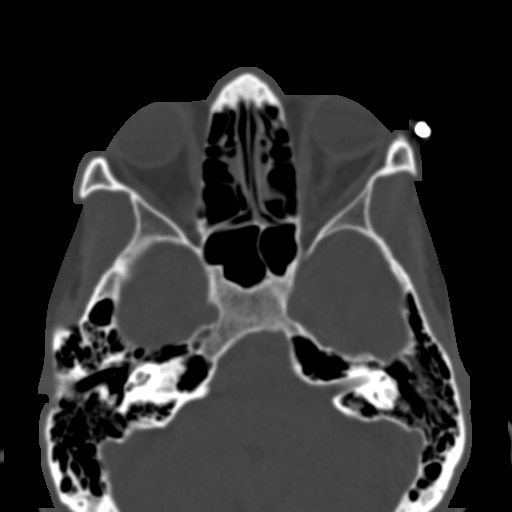
[im 88/95  bone]
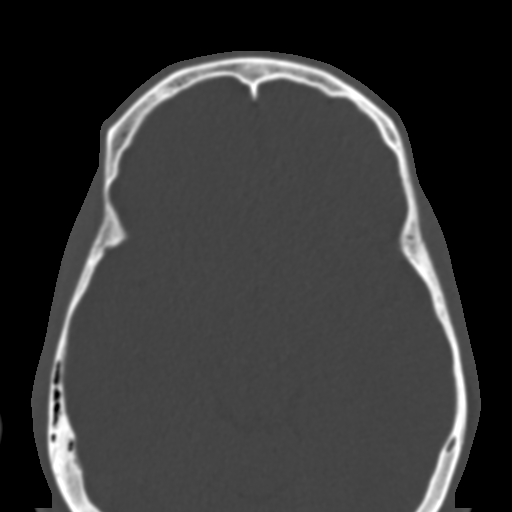

[Series 6: coronal soft · coronal · 0.36mm/px · 3 of 77 slices shown]
[im 26/77  bone]
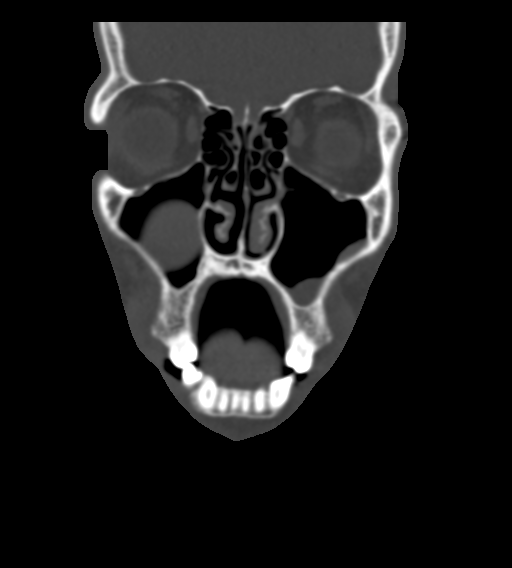
[im 34/77  bone]
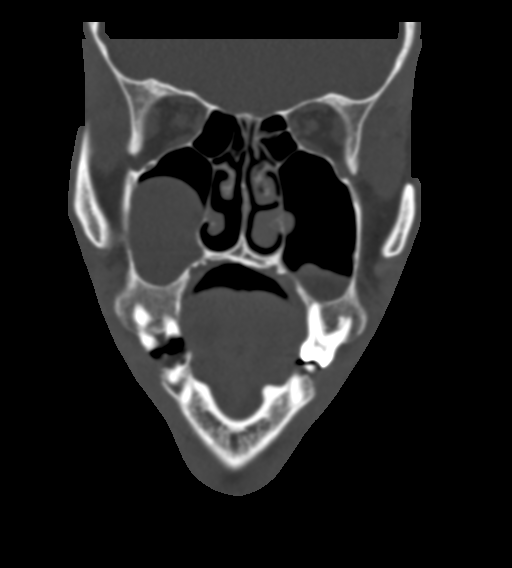
[im 43/77  bone]
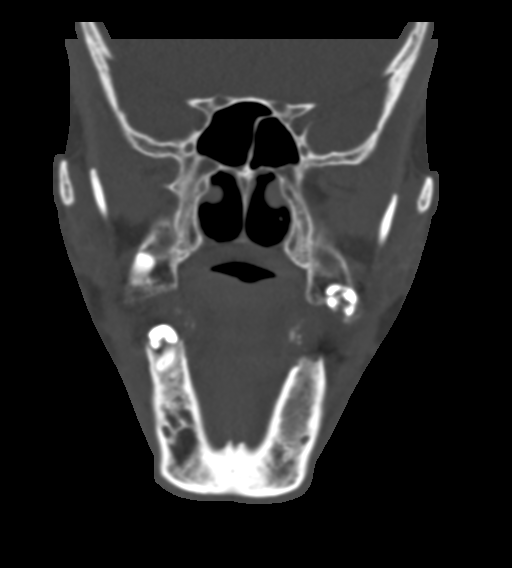

[Series 7: sagittal soft · sagittal · 0.32mm/px · 3 of 79 slices shown]
[im 27/79  bone]
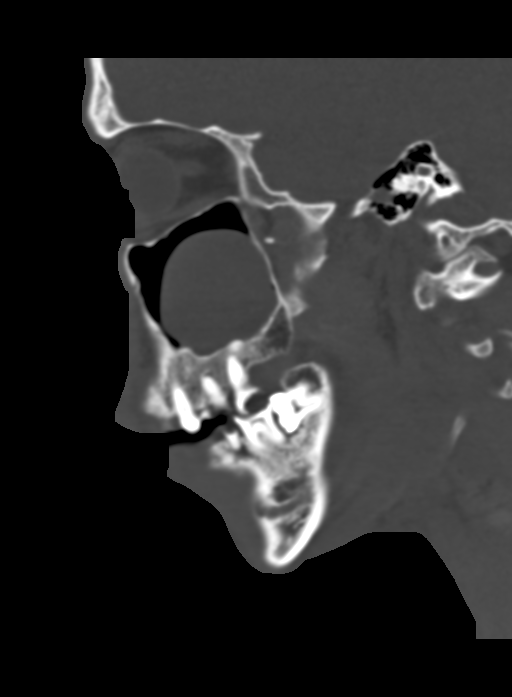
[im 40/79  bone]
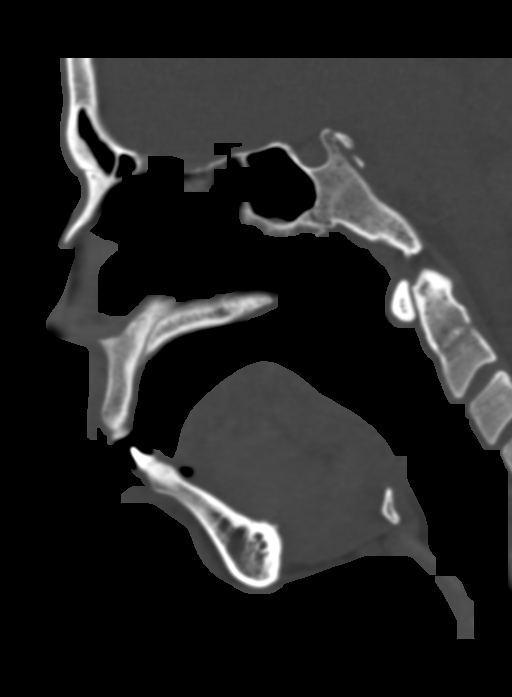
[im 53/79  bone]
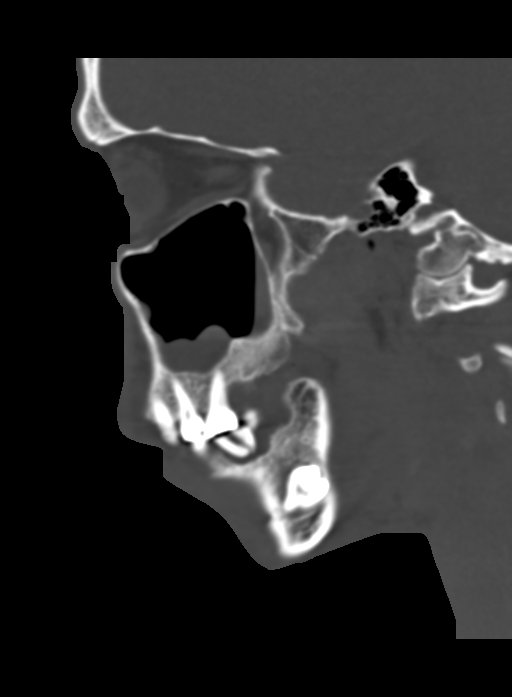

[14 of 47 positions shown; findings below may reference images not displayed]

FINDINGS: Osseous: Mandible intact and normally located. Poor dentition, and 2
posteriorly impacted molars in the left mandible (series 3, image
68). No maxilla, zygoma, pterygoid plate, or nasal bone fracture.
Central skull base appears intact. Congenital incomplete
ossification of the posterior C1 ring. Visible cervical vertebrae
appear intact.

Orbits: No orbital wall fracture. Incidental left lateral
periorbital soft tissue piercing. Globes and intraorbital soft
tissues appears symmetric and intact.

Sinuses: Resolved left frontal sinus opacification seen in 8611. But
there is a relatively large 3.6 cm right maxillary sinus mucous
retention cyst now. Mild bubbly opacity, mucosal thickening and/or
retention cysts in the lower left maxillary sinus also. Tympanic
cavities and mastoids are clear.

Soft tissues: Negative visible noncontrast deep soft tissue spaces
of the face and neck. Trace retained secretions in the left
nasopharynx.

Mild irregularity superficial left upper lip compatible with injury
on series 3, image 59. Incidental left nasal piercing.

Limited intracranial: Reported separately.
IMPRESSION: 1. Superficial left upper lip injury. No other acute traumatic
injury identified in the Face.
2. Poor dentition. Mild maxillary sinus inflammation, right greater
than left maxillary sinus retention cysts.

## 2024-05-12 ENCOUNTER — Ambulatory Visit: Payer: BC Managed Care – PPO | Admitting: Family Medicine

## 2024-08-16 ENCOUNTER — Ambulatory Visit: Admitting: Family Medicine
